# Patient Record
Sex: Female | Born: 2000 | Hispanic: Yes | Smoking: Never smoker
Health system: Southern US, Community
[De-identification: ages and names within clinical notes are randomized; demographics above are authoritative.]

## PROBLEM LIST (undated history)

## (undated) DIAGNOSIS — Z789 Other specified health status: Secondary | ICD-10-CM

## (undated) DIAGNOSIS — R87629 Unspecified abnormal cytological findings in specimens from vagina: Secondary | ICD-10-CM

## (undated) DIAGNOSIS — N6320 Unspecified lump in the left breast, unspecified quadrant: Secondary | ICD-10-CM

## (undated) HISTORY — DX: Other specified health status: Z78.9

## (undated) HISTORY — DX: Unspecified lump in the left breast, unspecified quadrant: N63.20

## (undated) HISTORY — PX: OTHER SURGICAL HISTORY: SHX169

## (undated) HISTORY — DX: Unspecified abnormal cytological findings in specimens from vagina: R87.629

---

## 2020-02-27 ENCOUNTER — Other Ambulatory Visit: Payer: Self-pay

## 2020-02-27 ENCOUNTER — Encounter: Payer: Self-pay | Admitting: Physician Assistant

## 2020-02-27 ENCOUNTER — Ambulatory Visit: Payer: Self-pay

## 2020-02-27 ENCOUNTER — Ambulatory Visit (LOCAL_COMMUNITY_HEALTH_CENTER): Payer: Self-pay | Admitting: Physician Assistant

## 2020-02-27 VITALS — BP 125/83 | Ht 63.0 in | Wt 194.0 lb

## 2020-02-27 DIAGNOSIS — Z3046 Encounter for surveillance of implantable subdermal contraceptive: Secondary | ICD-10-CM

## 2020-02-27 DIAGNOSIS — Z3009 Encounter for other general counseling and advice on contraception: Secondary | ICD-10-CM

## 2020-02-27 DIAGNOSIS — F32A Depression, unspecified: Secondary | ICD-10-CM

## 2020-02-27 DIAGNOSIS — Z01419 Encounter for gynecological examination (general) (routine) without abnormal findings: Secondary | ICD-10-CM

## 2020-02-27 DIAGNOSIS — Z30013 Encounter for initial prescription of injectable contraceptive: Secondary | ICD-10-CM

## 2020-02-27 DIAGNOSIS — F329 Major depressive disorder, single episode, unspecified: Secondary | ICD-10-CM

## 2020-02-27 DIAGNOSIS — Z113 Encounter for screening for infections with a predominantly sexual mode of transmission: Secondary | ICD-10-CM

## 2020-02-27 MED ORDER — THERA VITAL M PO TABS: 1.0000 | ORAL_TABLET | Freq: Every day | ORAL | 0 refills | Status: DC

## 2020-02-27 MED ORDER — MEDROXYPROGESTERONE ACETATE 150 MG/ML IM SUSP
150.0000 mg | INTRAMUSCULAR | Status: DC
  Administered 2020-02-27: 150 mg via INTRAMUSCULAR

## 2020-02-27 NOTE — Progress Notes (Signed)
Pt received Depo 150mg  IM per provider order and pt tolerated well. Pt scheduled to return 03/19/2020 at 10:40am for breast exam per 05/20/2020, PA verbal order. Appt card given to pt and pt aware to arrive early for check-in. Pt received MVI's per pt request. Sadie Haber, LCSW and cardinal cards with contact info given to pt per pt request and provider order. Provider orders completed.

## 2020-02-27 NOTE — Progress Notes (Signed)
Pt here for PE, Nexplanon removal and switch BCM's. Pt reports having irregular, long, painful periods, and weight gain with Nexplanon. RN counseling for Nexplanon removal completed and consent form reviewed and signed by pt.

## 2020-02-29 NOTE — Progress Notes (Signed)
Family Planning Visit- Repeat Yearly Visit  Subjective:  Savannah James is a 19 y.o. G2P2  being seen today for an well woman visit and to discuss family planning options.    She is currently using Nexplanon for pregnancy prevention. Patient reports she does not want a pregnancy in the next year. Patient  does not have a problem list on file.  Chief Complaint  Patient presents with  . Contraception    PE, Nexplanon removal and switch BCM    Patient reports that she would like her Nexplanon removed and to start Depo.  Reports that she has had irregular bleeding with the Nexplanon and did not have that with the Depo when she was on that method previously.  Reports that she has noticed a "ball" in her breast off and on for about 6 months.  States that it is more noticeable when she is having bleeding.  Also, concerned about weight gain.  Patient denies any other concerns today.    See flowsheet for other program required questions.   Body mass index is 34.37 kg/m. - Patient is eligible for diabetes screening based on BMI and age >59?  not applicable HA1C ordered? not applicable  Patient reports 1 of partners in last year. Desires STI screening?  Yes   Has patient been screened once for HCV in the past?  No  No results found for: HCVAB  Does the patient have current of drug use, have a partner with drug use, and/or has been incarcerated since last result? No  If yes-- Screen for HCV through [facility]   Does the patient meet criteria for HBV testing? No  Criteria:  -Household, sexual or needle sharing contact with HBV -History of drug use -HIV positive -Those with known Hep C   Health Maintenance Due  Topic Date Due  . Hepatitis C Screening  Never done  . COVID-19 Vaccine (1) Never done  . HIV Screening  Never done  . TETANUS/TDAP  Never done  . PAP-Cervical Cytology Screening  Never done  . PAP SMEAR-Modifier  Never done    Review of Systems  All other systems  reviewed and are negative.   The following portions of the patient's history were reviewed and updated as appropriate: allergies, current medications, past family history, past medical history, past social history, past surgical history and problem list. Problem list updated.  Objective:   Vitals:   02/27/20 1402  BP: 125/83  Weight: 194 lb (88 kg)  Height: 5\' 3"  (1.6 m)    Physical Exam Vitals and nursing note reviewed.  Constitutional:      General: She is not in acute distress.    Appearance: Normal appearance.  HENT:     Head: Normocephalic and atraumatic.  Eyes:     Conjunctiva/sclera: Conjunctivae normal.  Neck:     Thyroid: No thyroid mass, thyromegaly or thyroid tenderness.  Pulmonary:     Effort: Pulmonary effort is normal.  Chest:     Breasts:        Right: Normal.        Left: Normal.     Comments: L upper outer quadrant with area of thickened tissue. Slight tenderness, no distinct mass. Abdominal:     Palpations: Abdomen is soft. There is no mass.     Tenderness: There is no abdominal tenderness. There is no guarding or rebound.  Genitourinary:    General: Normal vulva.     Rectum: Normal.     Comments:  External genitalia/pubic area without nits, lice, edema, erythema, lesions and inguinal adenopathy. Vagina with normal mucosa, small amount of menstrual bleeding present. Cervix without visible lesions. Uterus firm, mobile, nt, no masses, no CMT, no adnexal tenderness or fullness. Musculoskeletal:     Cervical back: Neck supple. No tenderness.  Lymphadenopathy:     Upper Body:     Right upper body: No supraclavicular, axillary or pectoral adenopathy.     Left upper body: No supraclavicular, axillary or pectoral adenopathy.  Skin:    General: Skin is warm and dry.  Neurological:     Mental Status: She is alert and oriented to person, place, and time.  Psychiatric:        Mood and Affect: Mood normal.        Behavior: Behavior normal.        Thought  Content: Thought content normal.        Judgment: Judgment normal.       Assessment and Plan:  [name] is a 19 y.o. female G2P2 presenting to the [facility] Department for an yearly well woman exam/family planning visit  Contraception counseling: Reviewed all forms of birth control options in the tiered based approach. available including abstinence; over the counter/barrier methods; hormonal contraceptive medication including pill, patch, ring, injection,contraceptive implant, ECP; hormonal and nonhormonal IUDs; permanent sterilization options including vasectomy and the various tubal sterilization modalities. Risks, benefits, and typical effectiveness rates were reviewed.  Questions were answered.  Written information was also given to the patient to review.  Patient desires Nexplanon removal and to start Depo, this was prescribed for patient. She will follow up in  3-4 weeks for CBE and 3 months and prn for surveillance.  She was told to call with any further questions, or with any concerns about this method of contraception.  Emphasized use of condoms 100% of the time for STI prevention.  Patient was not a candidate for ECP.   1. Encounter for counseling regarding contraception Reviewed with patient that with the Depo patient can have similar SE as those she is having with Nexplanon. Rec condoms as back up for 2 weeks after shot today. Enc well balanced diet and regular exercise to help with general health and weight control. Enc MVI 1 po daily. Enc to establish with/follow up with PCP for illness, primary care concerns and age appropriate screening. RTC for repeat CBE in 3-4 weeks. - medroxyPROGESTERone (DEPO-PROVERA) injection 150 mg - Multiple Vitamins-Minerals (MULTIVITAMIN) tablet; Take 1 tablet by mouth daily.  Dispense: 100 tablet; Refill: 0  2. Nexplanon removal Nexplanon Removal Patient identified, informed consent performed, consent signed.    Appropriate time out taken. Nexplanon site identified.  Area prepped in usual sterile fashon. 3 ml of 1% lidocaine with Epinephrine was used to anesthetize the area at the distal end of the implant and along implant site. A small stab incision was made right beside the implant on the distal portion.  The Nexplanon rod was grasped manually  and removed without difficulty.  There was minimal blood loss. There were no complications.  Steri-strips were applied over the small incision.  A pressure bandage was applied to reduce any bruising.  The patient tolerated the procedure well and was given post procedure instructions.    Nexplanon:   Counseled patient to take OTC analgesic starting as soon as lidocaine starts to wear off and take regularly for at least 48 hr to decrease discomfort.  Specifically to take with food or milk to  decrease stomach upset and for IB 600 mg (3 tablets) every 6 hrs; IB 800 mg (4 tablets) every 8 hrs; or Aleve 2 tablets every 12 hrs.   3. Initiation of Depo Provera OK to start Depo 150mg  IM q 11-13 weeks for 1 year - medroxyPROGESTERone (DEPO-PROVERA) injection 150 mg  4. Pap smear, as part of routine gynecological examination Await test results.  Counseled that RN will call or send letter once results are back.  - Pap IG (Image Guided)  5. Screening for STD (sexually transmitted disease) Await test results.  Counseled that RN will call if needs to RTC for treatment once results are back.  - Chlamydia/Gonorrhea [facility]  6. Depression, unspecified depression type Patient referred to LCSW per her request. Give LCSW and [facility] cards to patient today.  - Ambulatory referral to Behavioral Health     Return in about 11 weeks (around 05/14/2020) for Depo, 03/19/2020 at 10:40am for breast exam, annual and PRN.  Future Appointments  Date Time Provider Department Center  03/19/2020 10:40 AM [facility] PROVIDER [facility] None    05/20/2020, PA

## 2020-03-02 LAB — PAP IG (IMAGE GUIDED): PAP Smear Comment: 0

## 2020-03-19 ENCOUNTER — Ambulatory Visit: Payer: Self-pay

## 2020-03-21 ENCOUNTER — Ambulatory Visit: Payer: Self-pay | Admitting: Licensed Clinical Social Worker

## 2020-03-21 ENCOUNTER — Ambulatory Visit (LOCAL_COMMUNITY_HEALTH_CENTER): Payer: Self-pay | Admitting: Physician Assistant

## 2020-03-21 ENCOUNTER — Other Ambulatory Visit: Payer: Self-pay

## 2020-03-21 ENCOUNTER — Encounter: Payer: Self-pay | Admitting: Physician Assistant

## 2020-03-21 VITALS — BP 134/83 | Ht <= 58 in | Wt 196.0 lb

## 2020-03-21 DIAGNOSIS — Z1239 Encounter for other screening for malignant neoplasm of breast: Secondary | ICD-10-CM

## 2020-03-21 DIAGNOSIS — Z3009 Encounter for other general counseling and advice on contraception: Secondary | ICD-10-CM

## 2020-03-21 DIAGNOSIS — Z09 Encounter for follow-up examination after completed treatment for conditions other than malignant neoplasm: Secondary | ICD-10-CM

## 2020-03-21 NOTE — Progress Notes (Signed)
Pt reports she is here for a repeat breast exam. Pt received Depo 02/27/2020, pt reports no issues with depo so far.

## 2020-03-21 NOTE — Progress Notes (Signed)
   [facility] problem [facility]  [facility]  Subjective:  Savannah James is a 19 y.o. being seen today for repeat CBE.  Chief Complaint  Patient presents with  . Contraception    repeat breast exam    HPI  Reports that she has not had any breast tenderness or felt the "ball" in her breast in 2 weeks.  States that she is doing well with the Depo and has stopped bleeding for now.   Does the patient have a current or past history of drug use? No   No components found for: HCV]   Health Maintenance Due  Topic Date Due  . Hepatitis C Screening  Never done  . COVID-19 Vaccine (1) Never done  . HIV Screening  Never done  . TETANUS/TDAP  Never done    Review of Systems  All other systems reviewed and are negative.   The following portions of the patient's history were reviewed and updated as appropriate: allergies, current medications, past family history, past medical history, past social history, past surgical history and problem list. Problem list updated.   See flowsheet for other program required questions.  Objective:   Vitals:   03/21/20 1459  BP: 134/83  Weight: 196 lb (88.9 kg)  Height: 4\' 10"  (1.473 m)    Physical Exam Vitals and nursing note reviewed.  Constitutional:      General: She is not in acute distress.    Appearance: Normal appearance.  HENT:     Head: Normocephalic and atraumatic.  Chest:     Breasts:        Right: Normal. No mass, nipple discharge, skin change or tenderness.        Left: Normal. No mass, nipple discharge, skin change or tenderness.     Comments: Thickened area of tissue in upper outer quadrant on left side is not present on exam today. Musculoskeletal:     Cervical back: Neck supple. No tenderness.  Lymphadenopathy:     Cervical: No cervical adenopathy.     Upper Body:     Right upper body: No supraclavicular, axillary or pectoral adenopathy.     Left upper body: No supraclavicular,  axillary or pectoral adenopathy.  Neurological:     Mental Status: She is alert.       Assessment and Plan:  Savannah James is a 19 y.o. female presenting to the [facility] Department for a Women's Health problem visit  1. Encounter for counseling regarding contraception Reviewed with patient SE of Depo and when to call clinic with bleeding concerns. Rec condoms with all sex. RTC for next Depo in about 7-8 weeks.  2. Follow-up exam Counseled that pap came back unsatisfactory due to not having enough cells. Counseled patient that she will need to make her next appointment for a pap and depo so that her pap can be repeated.  3. Screening breast examination Breast exam today is normal. Counseled patient that likely "ball" she had felt previously was due to hormonal changes related to bleeding and that she may experience this in the future around the time that her Depo is due or if she decides to stop using hormonal BCM she could have this each month with her periods. Enc patient to do regular SBE and call with any concerns.   There are no diagnoses linked to this encounter.    No follow-ups on file.  No future appointments.  [facility], PA

## 2020-05-07 ENCOUNTER — Telehealth: Payer: Self-pay

## 2020-05-07 NOTE — Telephone Encounter (Signed)
Telephone call to patient today to reschedule her PAP (due to an unsatisfactory PAP 02/2020).  Appointment rescheduled for 05/10/2020 at 9:20am ( arrival time 9:05am).  Marigene Ehlers interpreted the call and assisted with scheduling the appointment in Epic. Hart Carwin, RN

## 2020-05-10 ENCOUNTER — Ambulatory Visit: Payer: Self-pay

## 2020-05-18 ENCOUNTER — Ambulatory Visit: Payer: Self-pay

## 2020-06-21 ENCOUNTER — Ambulatory Visit (LOCAL_COMMUNITY_HEALTH_CENTER): Payer: Self-pay | Admitting: Physician Assistant

## 2020-06-21 ENCOUNTER — Other Ambulatory Visit: Payer: Self-pay

## 2020-06-21 VITALS — BP 135/82 | Ht 62.0 in | Wt 198.0 lb

## 2020-06-21 DIAGNOSIS — Z01419 Encounter for gynecological examination (general) (routine) without abnormal findings: Secondary | ICD-10-CM

## 2020-06-21 DIAGNOSIS — Z3009 Encounter for other general counseling and advice on contraception: Secondary | ICD-10-CM

## 2020-06-21 NOTE — Progress Notes (Signed)
Pap only today per patient.  Declines GC/Chlamydia. 02/27/20 was last Depo; trying to conceive; on MVI. Richmond Campbell, RN

## 2020-06-22 NOTE — Progress Notes (Signed)
[facility] problem [facility]  [facility]  Subjective:  Savannah James is a 19 y.o. being seen today for repeat pap.  No chief complaint on file.   HPI  Patient states that she needs a repeat pap due to unsatisfactory result on previous one.  Also, states that she would like to get pregnant and wants to d/c Depo.     Does the patient have a current or past history of drug use? No   No components found for: HCV]   Health Maintenance Due  Topic Date Due  . Hepatitis C Screening  Never done  . COVID-19 Vaccine (1) Never done  . HIV Screening  Never done  . TETANUS/TDAP  Never done  . INFLUENZA VACCINE  Never done    Review of Systems  All other systems reviewed and are negative.   The following portions of the patient's history were reviewed and updated as appropriate: allergies, current medications, past family history, past medical history, past social history, past surgical history and problem list. Problem list updated.   See flowsheet for other program required questions.  Objective:   Vitals:   06/21/20 1004  BP: 135/82  Weight: 198 lb (89.8 kg)  Height: 5\' 2"  (1.575 m)    Physical Exam Vitals and nursing note reviewed.  Constitutional:      General: She is not in acute distress.    Appearance: Normal appearance.  HENT:     Head: Normocephalic and atraumatic.  Eyes:     Conjunctiva/sclera: Conjunctivae normal.  Pulmonary:     Effort: Pulmonary effort is normal.  Abdominal:     Palpations: Abdomen is soft. There is no mass.     Tenderness: There is no abdominal tenderness. There is no guarding or rebound.  Genitourinary:    General: Normal vulva.     Rectum: Normal.     Comments: External genitalia/pubic area without nits, lice, edema, erythema, lesions and inguinal adenopathy. Vagina with normal mucosa and discharge. Cervix without visible lesions. Uterus firm, mobile, nt, no masses, no CMT, no adnexal  tenderness or fullness. Skin:    General: Skin is warm and dry.     Findings: No bruising, erythema, lesion or rash.  Neurological:     Mental Status: She is alert and oriented to person, place, and time.  Psychiatric:        Mood and Affect: Mood normal.        Behavior: Behavior normal.        Thought Content: Thought content normal.        Judgment: Judgment normal.       Assessment and Plan:  Savannah James is a 19 y.o. female presenting to the [facility] Department for a Women's Health problem visit  1. Encounter for counseling regarding contraception Counseled patient that Depo that she received in June will protect her from pregnancy until 06/25/2020, and after that she could get pregnant at any time. Counseled that it could take up to 18 months after d/c depo for periods to return to a normal pattern and in the meantime she could have irregular bleeding.  Reassured that this is normal and that it can sometimes frustrate people who are desiring a pregnancy and take them longer to achieve the pregnancy. RTC if has irregular bleeding that is heavy and frequent or to  discuss possibility of OCs for a few months to help regulate periods .  2. Pap test, as part  of routine gynecological examination Await test results.  Counseled that RN will call or send letter after results are back.  - Pap IG (Image Guided)     No follow-ups on file.  No future appointments.  Matt Holmes, PA

## 2020-06-24 LAB — PAP IG (IMAGE GUIDED): PAP Smear Comment: 0

## 2020-09-08 NOTE — L&D Delivery Note (Signed)
Delivery Note  Date of delivery: 05/14/2021 Estimated Date of Delivery: 05/09/21 Patient's last menstrual period was 08/01/2020. EGA: [redacted]w[redacted]d  Delivery Note At 9:15 PM a viable child was delivered via Vaginal, Spontaneous (Presentation: LOA).  APGAR: 8, 9; weight pending. Placenta status: Intact Spontaneous, Intact.  Cord: 3 vessels with the following complications: None.  Cord pH: n/a  Anesthesia: Epidural Episiotomy: None Lacerations: None Suture Repair:  n/a Est. Blood Loss (mL): 150  First Stage: Labor onset: 1230 Augmentation : Pitocin and AROM Analgesia /Anesthesia intrapartum: Epidural AROM at 1910  Savannah James presented to L&D with uterine contractions. She was augmented with pitocin. Epidural placed for pain relief.   Second Stage: Complete dilation at 2056 Onset of pushing at 2102 FHR second stage Cat I Delivery at 2115 on 05/14/2021  She progressed to complete and had a spontaneous vaginal birth of a live female over an intact perineum. The fetal head was delivered in OA position with restitution to LOA. No nuchal cord. Anterior then posterior shoulders delivered spontaneously. Baby placed on mom's abdomen and attended to by transition RN. Cord clamped and cut at 1 min by FOB. Cord blood obtained for newborn labs.  Third Stage: Placenta delivered intact with 3VC at 2122  Placenta disposition: routine disposal Uterine tone firm / bleeding min IV pitocin given for hemorrhage prophylaxis  No laceration identified  Anesthesia for repair: n/a Repair n/a Est. Blood Loss (mL):  Complications: none  Mom to postpartum.  Baby to Couplet care / Skin to Skin.  Newborn: Birth Weight: pending  Apgar Scores: 8, 9 Feeding planned: Both   Cyril Mourning, [place] 05/14/2021 9:27 PM

## 2020-09-10 ENCOUNTER — Other Ambulatory Visit: Payer: Self-pay

## 2020-09-10 ENCOUNTER — Ambulatory Visit (LOCAL_COMMUNITY_HEALTH_CENTER): Payer: Self-pay

## 2020-09-10 VITALS — BP 137/83 | Ht 62.0 in | Wt 201.5 lb

## 2020-09-10 DIAGNOSIS — Z3201 Encounter for pregnancy test, result positive: Secondary | ICD-10-CM

## 2020-09-10 LAB — PREGNANCY, URINE: Preg Test, Ur: POSITIVE — AB

## 2020-09-10 MED ORDER — PRENATAL 27-0.8 MG PO TABS: 1.0000 | ORAL_TABLET | Freq: Every day | ORAL | 0 refills | Status: AC

## 2020-09-10 NOTE — Progress Notes (Signed)
UPT positive. No [facility] on file. Plans prenatal care at [facility]. Sent to clerk for preadmit. Jerel Shepherd, RN

## 2020-10-15 NOTE — Progress Notes (Signed)
Patient and family history abstracted per R. Lassiter phone call to patient.Burt Knack, RN

## 2020-10-23 ENCOUNTER — Telehealth: Payer: Self-pay

## 2020-10-23 NOTE — Telephone Encounter (Signed)
TC to patient to reschedule tomorrow's appointment due to building maintenance issues. LM with number to call. Interpreter, [place], [place] #[id number] [facility] [employer]).Burt Knack, RN

## 2020-10-24 ENCOUNTER — Other Ambulatory Visit: Payer: Self-pay

## 2020-10-24 NOTE — Telephone Encounter (Signed)
Per Epic appt desk, has rescheduled new OB appt for 10/31/2020. Jossie Ng, RN

## 2020-10-31 ENCOUNTER — Other Ambulatory Visit: Payer: Self-pay

## 2020-10-31 ENCOUNTER — Ambulatory Visit: Payer: Medicaid Other | Admitting: Advanced Practice Midwife

## 2020-10-31 ENCOUNTER — Other Ambulatory Visit: Payer: Self-pay | Admitting: Family Medicine

## 2020-10-31 ENCOUNTER — Encounter: Payer: Self-pay | Admitting: Advanced Practice Midwife

## 2020-10-31 VITALS — BP 119/78 | HR 86 | Temp 98.8°F | Wt 198.6 lb

## 2020-10-31 DIAGNOSIS — Z3481 Encounter for supervision of other normal pregnancy, first trimester: Secondary | ICD-10-CM | POA: Diagnosis not present

## 2020-10-31 DIAGNOSIS — O99211 Obesity complicating pregnancy, first trimester: Secondary | ICD-10-CM

## 2020-10-31 DIAGNOSIS — N6320 Unspecified lump in the left breast, unspecified quadrant: Secondary | ICD-10-CM | POA: Insufficient documentation

## 2020-10-31 DIAGNOSIS — O99210 Obesity complicating pregnancy, unspecified trimester: Secondary | ICD-10-CM | POA: Insufficient documentation

## 2020-10-31 LAB — URINALYSIS
Bilirubin, UA: NEGATIVE
Glucose, UA: NEGATIVE
Ketones, UA: NEGATIVE
Leukocytes,UA: NEGATIVE
Nitrite, UA: NEGATIVE
Protein,UA: NEGATIVE
RBC, UA: NEGATIVE
Specific Gravity, UA: 1.015 (ref 1.005–1.030)
Urobilinogen, Ur: 0.2 mg/dL (ref 0.2–1.0)
pH, UA: 6 (ref 5.0–7.5)

## 2020-10-31 LAB — WET PREP FOR TRICH, YEAST, CLUE
Trichomonas Exam: NEGATIVE
Yeast Exam: NEGATIVE

## 2020-10-31 LAB — HEMOGLOBIN, FINGERSTICK: Hemoglobin: 12.7 g/dL (ref 11.1–15.9)

## 2020-10-31 NOTE — Progress Notes (Signed)
Here today for 13.0 week MH IP. Taking PNV QD. Denies ED.hospital visits since +PT. Early 1hgtt today. Declines both Flu and Covid Vaccines. Tawny Hopping, RN

## 2020-10-31 NOTE — Progress Notes (Addendum)
Hgb, UA and wet mount results reviewed. Per standing orders no treatment indicated. Epic computer system currently down. Explained to patient to call facility to reschedule next MH appt. Informed patient that [facility] and [facility] will be calling patient to schedule needed appts. Tawny Hopping, RN

## 2020-10-31 NOTE — Progress Notes (Signed)
[facility] HEALTH DEPT [facility] [address] [place] [address] [phone]  INITIAL PRENATAL VISIT NOTE  Subjective:  Savannah James is a 20 y.o. SHF G3P2002 (6, 3) nonsmoker at [redacted]w[redacted]d being seen today to start prenatal care at the [facility] Department. She feels "good" about surprise pregnancy with last DMPA 02/27/20.  22 yo employed FOB feels "good" about pregnancy; he is the father of all of her children.  She is living with her 71 yo m. Cousin (female) and her 2 children. Denies cigs, vaping, cigars.  Last ETOH 08/31/20 (6 shots Tequila) q 3 mo. Denies any u/s or ER use this pregnancy.  LMP 08/01/20. Last pap 06/21/20 neg. Wants BTL.  She is currently monitored for the following issues for this low-risk pregnancy and has Obesity affecting pregnancy BMI=36.3; Breast mass, left; and Supervision of normal intrauterine pregnancy in multigravida in first trimester on their problem list.  Patient reports no complaints.  Contractions: Not present. Vag. Bleeding: None.  Movement: Absent. Denies leaking of fluid.   Indications for ASA therapy (per uptodate) One of the following: Previous pregnancy with preeclampsia, especially early onset and with an adverse outcome No Multifetal gestation No Chronic hypertension No Type 1 or 2 diabetes mellitus No Chronic kidney disease No Autoimmune disease (antiphospholipid syndrome, systemic lupus erythematosus) No  Two or more of the following: Nulliparity No Obesity (body mass index >30 kg/m2) Yes Family history of preeclampsia in mother or sister No Age ?35 years No Sociodemographic characteristics (African American race, low socioeconomic level) No Personal risk factors (eg, previous pregnancy with low birth weight or small for gestational age infant, previous adverse pregnancy outcome [eg, stillbirth], interval >10 years between pregnancies) No   The following portions of the  patient's history were reviewed and updated as appropriate: allergies, current medications, past family history, past medical history, past social history, past surgical history and problem list. Problem list updated.  Objective:   Vitals:   10/31/20 1335  BP: 119/78  Pulse: 86  Temp: 98.8 F (37.1 C)  Weight: 198 lb 9.6 oz (90.1 kg)    Fetal Status: Fetal Heart Rate (bpm): not heard Fundal Height: 10 cm Movement: Absent  Presentation: Undeterminableno FHR heard, fundal height=10 wks size, denies vaginal bleeding See maternal flow sheet  Physical Exam Constitutional:      Appearance: Normal appearance. She is obese.  HENT:     Head: Normocephalic.     Nose: Nose normal.     Mouth/Throat:     Mouth: Mucous membranes are moist.  Eyes:     Conjunctiva/sclera: Conjunctivae normal.  Neck:     Comments: Thyroid without masses or enlargement Negative cervical adenopathy Cardiovascular:     Rate and Rhythm: Normal rate and regular rhythm.     Heart sounds: Normal heart sounds.  Pulmonary:     Effort: Pulmonary effort is normal.     Breath sounds: Normal breath sounds.  Chest:  Breasts:     Right: Normal.      Comments: Left breast 1:00 position 3x3 cm nontender firm round mass 12:00 position 1x1 cm round nontender firm mass onset 01/2020 Abdominal:     Palpations: Abdomen is soft.     Comments: Soft without masses or tenderness Fundus 10 wks size, no FHR heard  Genitourinary:    General: Normal vulva.     Exam position: Lithotomy position.     Vagina: Vaginal discharge (white creamy leukorrhea, ph<4.5) present.  Cervix: Normal.     Uterus: Normal. Enlarged (enlarged 10 wks size).      Rectum: Normal.  Musculoskeletal:        General: Normal range of motion.     Cervical back: Normal range of motion and neck supple.  Skin:    General: Skin is warm and dry.  Neurological:     Mental Status: She is alert.  Psychiatric:        Mood and Affect: Mood normal.       Assessment and Plan:  Pregnancy: G3P2002 at [redacted]w[redacted]d  1. Obesity affecting pregnancy, antepartum Agrees to take ASA 81 mg daily Counseled on wt gain of 11-20 lb this pregnancy  2. Supervision of normal intrauterine pregnancy in multigravida in first trimester Desires Quad screen U/s ordered [facility] for viability/dating now Referred to BCCCP for 2 breast masses in left breast since 01/2020 1 hour glucola today - Lead, blood (adult age 17 yrs or greater) - Glucose, 1 hour gestational - HCV Ab w/Rflx to Verification - Hemoglobinopathy evaluation -[id number] - Hgb A1c w/o eAG - HIV Antibody (routine testing w rflx) - Comprehensive metabolic panel - Prenatal Profile with Varicella(282020) - Protein / creatinine ratio, urine  (Spot) - TSH - Urine Culture - Chlamydia/GC NAA, Confirmation - QuantiFERON-TB Gold Plus - 633354 Drug Screen - WET PREP FOR TRICH, YEAST, CLUE - Urinalysis (Urine Dip) - Hemoglobin, venipuncture - [place] MFM OB COMPLETE LESS THAN 14 WEEKS; Future  3. Breast mass, left Referred to BCCCP for 2 breast masses in left breast since 01/2020 (no insurance or Medicaid); see above for details    Discussed overview of care and coordination with inpatient delivery practices including [facility], [facility], [facility] and [facility].   Reviewed Centering pregnancy as standard of care at [facility], oriented to room and showed video. Based on EDD, plan for Cycle .    Preterm labor symptoms and general obstetric precautions including but not limited to vaginal bleeding, contractions, leaking of fluid and fetal movement were reviewed in detail with the patient.  Please refer to After Visit Summary for other counseling recommendations.   Return in about 4 weeks (around 11/28/2020) for routine PNC.  Future Appointments  Date Time Provider Department Center  11/13/2020  8:00 AM [facility] CLINIC [facility] None  11/30/2020  2:00 PM [facility] PROVIDER [facility] None    Alberteen Spindle,  CNM

## 2020-11-01 LAB — LEAD, BLOOD (ADULT >= 16 YRS): Lead-Whole Blood: <1 ug/dL (ref 0–4)

## 2020-11-02 LAB — URINE CULTURE: Organism ID, Bacteria: NO GROWTH

## 2020-11-02 LAB — 789231 7+OXYCODONE-BUND
Amphetamines, Urine: NEGATIVE ng/mL
BENZODIAZ UR QL: NEGATIVE ng/mL
Barbiturate screen, urine: NEGATIVE ng/mL
Cannabinoid Quant, Ur: NEGATIVE ng/mL
Cocaine (Metab.): NEGATIVE ng/mL
OPIATE SCREEN URINE: NEGATIVE ng/mL
Oxycodone/Oxymorphone, Urine: NEGATIVE ng/mL
PCP Quant, Ur: NEGATIVE ng/mL

## 2020-11-02 LAB — PROTEIN / CREATININE RATIO, URINE
Creatinine, Urine: 71.4 mg/dL
Protein, Ur: 6.4 mg/dL
Protein/Creat Ratio: 90 mg/g creat (ref 0–200)

## 2020-11-03 LAB — CBC/D/PLT+RPR+RH+ABO+RUB AB...
Antibody Screen: NEGATIVE
Basophils Absolute: 0 10*3/uL (ref 0.0–0.2)
Basos: 0 %
EOS (ABSOLUTE): 0.1 10*3/uL (ref 0.0–0.4)
Eos: 2 %
Hematocrit: 39 % (ref 34.0–46.6)
Hemoglobin: 14.1 g/dL (ref 11.1–15.9)
Hepatitis B Surface Ag: NEGATIVE
Immature Grans (Abs): 0.1 10*3/uL (ref 0.0–0.1)
Immature Granulocytes: 1 %
Lymphocytes Absolute: 2.2 10*3/uL (ref 0.7–3.1)
Lymphs: 24 %
MCH: 31.8 pg (ref 26.6–33.0)
MCHC: 36.2 g/dL — ABNORMAL HIGH (ref 31.5–35.7)
MCV: 88 fL (ref 79–97)
Monocytes Absolute: 0.6 10*3/uL (ref 0.1–0.9)
Monocytes: 7 %
Neutrophils Absolute: 6 10*3/uL (ref 1.4–7.0)
Neutrophils: 66 %
Platelets: 236 10*3/uL (ref 150–450)
RBC: 4.43 x10E6/uL (ref 3.77–5.28)
RDW: 12.9 % (ref 11.7–15.4)
RPR Ser Ql: NONREACTIVE
Rh Factor: POSITIVE
Rubella Antibodies, IGG: 4.57 index (ref >0.99)
Varicella zoster IgG: 385 index (ref >165)
WBC: 8.9 10*3/uL (ref 3.4–10.8)

## 2020-11-03 LAB — QUANTIFERON-TB GOLD PLUS
QuantiFERON Mitogen Value: >10 IU/mL
QuantiFERON Nil Value: 0 IU/mL
QuantiFERON TB1 Ag Value: 0.03 IU/mL
QuantiFERON TB2 Ag Value: 0.01 IU/mL
QuantiFERON-TB Gold Plus: NEGATIVE

## 2020-11-03 LAB — COMPREHENSIVE METABOLIC PANEL
ALT: 12 IU/L (ref 0–32)
AST: 14 IU/L (ref 0–40)
Albumin/Globulin Ratio: 1.7 (ref 1.2–2.2)
Albumin: 4.4 g/dL (ref 3.9–5.0)
Alkaline Phosphatase: 66 IU/L (ref 44–121)
BUN/Creatinine Ratio: 10 (ref 9–23)
BUN: 6 mg/dL (ref 6–20)
Bilirubin Total: 0.3 mg/dL (ref 0.0–1.2)
CO2: 21 mmol/L (ref 20–29)
Calcium: 9.5 mg/dL (ref 8.7–10.2)
Chloride: 102 mmol/L (ref 96–106)
Creatinine, Ser: 0.58 mg/dL (ref 0.57–1.00)
GFR calc Af Amer: 152 mL/min/{1.73_m2} (ref >59)
GFR calc non Af Amer: 132 mL/min/{1.73_m2} (ref >59)
Globulin, Total: 2.6 g/dL (ref 1.5–4.5)
Glucose: 77 mg/dL (ref 65–99)
Potassium: 4.1 mmol/L (ref 3.5–5.2)
Sodium: 139 mmol/L (ref 134–144)
Total Protein: 7 g/dL (ref 6.0–8.5)

## 2020-11-03 LAB — HGB FRACTIONATION CASCADE
Hgb A2: 2.7 % (ref 1.8–3.2)
Hgb A: 96.1 % — ABNORMAL LOW (ref 96.4–98.8)
Hgb F: 1.2 % (ref 0.0–2.0)
Hgb S: 0 %

## 2020-11-03 LAB — HIV-1/HIV-2 QUALITATIVE RNA
HIV-1 RNA, Qualitative: NONREACTIVE
HIV-2 RNA, Qualitative: NONREACTIVE

## 2020-11-03 LAB — HCV INTERPRETATION

## 2020-11-03 LAB — TSH: TSH: 1.48 u[IU]/mL (ref 0.450–4.500)

## 2020-11-03 LAB — HGB A1C W/O EAG: Hgb A1c MFr Bld: 4.5 % — ABNORMAL LOW (ref 4.8–5.6)

## 2020-11-03 LAB — GLUCOSE, 1 HOUR GESTATIONAL: Gestational Diabetes Screen: 90 mg/dL (ref 65–139)

## 2020-11-03 LAB — HCV AB W REFLEX TO QUANT PCR: HCV Ab: <0.1 s/co ratio (ref 0.0–0.9)

## 2020-11-04 LAB — CHLAMYDIA/GC NAA, CONFIRMATION
Chlamydia trachomatis, NAA: NEGATIVE
Neisseria gonorrhoeae, NAA: NEGATIVE

## 2020-11-08 ENCOUNTER — Telehealth: Payer: Self-pay | Admitting: Family Medicine

## 2020-11-08 NOTE — Telephone Encounter (Signed)
Pt states that she was told at her last visit that she would be referred for an ultrasound and she has not received a call from anyone regarding an [place]

## 2020-11-09 ENCOUNTER — Other Ambulatory Visit: Payer: Self-pay | Admitting: Advanced Practice Midwife

## 2020-11-09 DIAGNOSIS — Z3481 Encounter for supervision of other normal pregnancy, first trimester: Secondary | ICD-10-CM

## 2020-11-09 NOTE — Telephone Encounter (Signed)
Electronic order corrected by provider by adding transvaginal US as needed and call made to scheduler. Appt for viability / dating [place] scheduled for 11/12/2020 at 5:00 pm at [facility]. Call to client and left message to call ASAP for [place] appt. No emergency contact available and request to obtain added to pink sticky note. Jossie Ng, RN

## 2020-11-12 ENCOUNTER — Ambulatory Visit: Admission: RE | Admit: 2020-11-12 | Payer: Self-pay | Source: Ambulatory Visit

## 2020-11-12 NOTE — Telephone Encounter (Signed)
Call to client with [facility] viability [place] today at 5 pm. Left message with above information and requested she return call. Number to call provided. Jossie Ng, RN

## 2020-11-12 NOTE — Telephone Encounter (Signed)
Call to client as no response yet to previous calls. Viability [place] scheduled this pm at [facility] at 1700. Per [employer] ID [phone], left message with number to call provided. Jossie Ng, RN

## 2020-11-13 ENCOUNTER — Telehealth: Payer: Self-pay | Admitting: Family Medicine

## 2020-11-13 ENCOUNTER — Ambulatory Visit: Payer: Self-pay

## 2020-11-13 NOTE — Telephone Encounter (Signed)
Pt. stopped by today to see when her ultrasoud will be scheduled.

## 2020-11-13 NOTE — Telephone Encounter (Signed)
MH RN to call [facility] and reschedule U/S and will call pt once new appt is made.

## 2020-11-13 NOTE — Telephone Encounter (Signed)
Phone call to pt at [phone] with interpreter Salli Real. Pt confirms that she came to [facility] to inquire about U/S appt this morning; told by clerical that she would leave message for RN and RN would call her back.  Pt confirms that [phone] is best number. Pt counseled that her U/S appt was actually scheduled for yesterday at 5:00 PM.  Pt requests that U/S be rescheduled for 11/15/20, anytime that day.

## 2020-11-13 NOTE — Telephone Encounter (Signed)
Letter in Spanish written by Dr. Alvester Morin with 11/14/2020 [place] appt information. As no response from client via phone, letter delivered to address of record by Jamse Arn RN. Per Ms. Roseanne Reno, letter left on door as no response to her knock. Jossie Ng, RN

## 2020-11-13 NOTE — Telephone Encounter (Signed)
As unable to contact client via phone for viability [place] on 11/12/2020 at 1700, appt was not kept and [place] needs to be rescheduled. New appt is 11/14/2020 with arrival time of 0945 (with a full bladder). No available appts on 11/15/2020 as requested by client. Call to client with [facility] [employer] ID [phone] and left message with [place] appt and requested she call back for additional instructions (full bladder needed). Number to call provided. Jossie Ng, RN

## 2020-11-13 NOTE — Telephone Encounter (Signed)
Pt missed call from nurse about missed ultrasound appt. Was calling back to confirm new appt was scheduled.

## 2020-11-13 NOTE — Telephone Encounter (Signed)
See new phone note started 11/13/20.

## 2020-11-14 ENCOUNTER — Other Ambulatory Visit: Payer: Self-pay | Admitting: Advanced Practice Midwife

## 2020-11-14 ENCOUNTER — Other Ambulatory Visit: Payer: Self-pay

## 2020-11-14 ENCOUNTER — Encounter: Payer: Self-pay | Admitting: Advanced Practice Midwife

## 2020-11-14 ENCOUNTER — Ambulatory Visit
Admission: RE | Admit: 2020-11-14 | Discharge: 2020-11-14 | Disposition: A | Discharge status: Home / Self Care | Payer: Self-pay | Source: Ambulatory Visit | Attending: Advanced Practice Midwife | Admitting: Advanced Practice Midwife

## 2020-11-14 DIAGNOSIS — Z3481 Encounter for supervision of other normal pregnancy, first trimester: Secondary | ICD-10-CM | POA: Insufficient documentation

## 2020-11-14 DIAGNOSIS — O4400 Placenta previa specified as without hemorrhage, unspecified trimester: Secondary | ICD-10-CM | POA: Insufficient documentation

## 2020-11-14 NOTE — Telephone Encounter (Signed)
Call to [facility] sonography phone number and verified with receptionist that client was scanned this am. Report pending. Jossie Ng, RN

## 2020-11-21 ENCOUNTER — Ambulatory Visit
Admission: RE | Admit: 2020-11-21 | Discharge: 2020-11-21 | Disposition: A | Discharge status: Home / Self Care | Payer: Self-pay | Source: Ambulatory Visit | Attending: Oncology | Admitting: Oncology

## 2020-11-21 ENCOUNTER — Ambulatory Visit: Payer: Self-pay | Attending: Oncology

## 2020-11-21 ENCOUNTER — Other Ambulatory Visit: Payer: Self-pay

## 2020-11-21 VITALS — BP 118/72 | HR 88 | Temp 97.6°F | Ht 63.39 in | Wt 200.4 lb

## 2020-11-21 DIAGNOSIS — N63 Unspecified lump in unspecified breast: Secondary | ICD-10-CM

## 2020-11-21 NOTE — Progress Notes (Signed)
  Subjective:     Patient ID: Savannah James, female   DOB: Jul 31, 2001, 20 y.o.   MRN: [id number]  HPI   Review of Systems     Objective:   Physical Exam Chest:  Breasts:     Right: Mass present. No swelling, bleeding, inverted nipple, nipple discharge, skin change or tenderness.     Left: Mass present. No swelling, bleeding, inverted nipple, nipple discharge, skin change or tenderness.          Assessment:     20 year old Hispanic patient , [redacted] weeks pregnant, referred from [facility] for 2 left breast masses.  Patient states masses have been present for one year.  States she was taken off of her birth control to hopefully decrease mass, but did not work, and she feels they are getting larger. Patient screened, and meets BCCCP eligibility.  Patient does not have insurance, Medicare or Medicaid. Instructed patient on breast self awareness using teach back method.  Palpated 2 firm mobile masses left breast 3 cm at 1:00 and linear mass angled from 11-12 o'clock left breast.  Palpated a thickening at 12 o'clock right breast. Fuller Song Laukaitis interpreted exam.     Plan:     Sent for bilateral breast ultrasound.

## 2020-11-22 ENCOUNTER — Other Ambulatory Visit: Payer: Self-pay | Admitting: *Deleted

## 2020-11-22 DIAGNOSIS — N63 Unspecified lump in unspecified breast: Secondary | ICD-10-CM

## 2020-11-26 ENCOUNTER — Other Ambulatory Visit: Payer: Self-pay | Admitting: *Deleted

## 2020-11-30 ENCOUNTER — Ambulatory Visit
Admission: RE | Admit: 2020-11-30 | Discharge: 2020-11-30 | Disposition: A | Discharge status: Home / Self Care | Payer: Self-pay | Source: Ambulatory Visit | Attending: Oncology | Admitting: Oncology

## 2020-11-30 ENCOUNTER — Ambulatory Visit: Payer: Medicaid Other | Admitting: Advanced Practice Midwife

## 2020-11-30 ENCOUNTER — Ambulatory Visit: Payer: Self-pay

## 2020-11-30 ENCOUNTER — Other Ambulatory Visit: Payer: Self-pay

## 2020-11-30 VITALS — BP 124/76 | HR 85 | Temp 98.6°F | Wt 201.6 lb

## 2020-11-30 DIAGNOSIS — N6320 Unspecified lump in the left breast, unspecified quadrant: Secondary | ICD-10-CM

## 2020-11-30 DIAGNOSIS — O99212 Obesity complicating pregnancy, second trimester: Secondary | ICD-10-CM | POA: Diagnosis not present

## 2020-11-30 DIAGNOSIS — Z3482 Encounter for supervision of other normal pregnancy, second trimester: Secondary | ICD-10-CM | POA: Diagnosis not present

## 2020-11-30 DIAGNOSIS — O4402 Placenta previa specified as without hemorrhage, second trimester: Secondary | ICD-10-CM | POA: Diagnosis not present

## 2020-11-30 DIAGNOSIS — N63 Unspecified lump in unspecified breast: Secondary | ICD-10-CM

## 2020-11-30 DIAGNOSIS — O99210 Obesity complicating pregnancy, unspecified trimester: Secondary | ICD-10-CM

## 2020-11-30 DIAGNOSIS — Z3481 Encounter for supervision of other normal pregnancy, first trimester: Secondary | ICD-10-CM

## 2020-11-30 NOTE — Progress Notes (Signed)
   PRENATAL VISIT NOTE  Subjective:  Savannah James is a 20 y.o. G3P2002 at [redacted]w[redacted]d being seen today for ongoing prenatal care.  She is currently monitored for the following issues for this low-risk pregnancy and has Obesity affecting pregnancy BMI=36.3; Breast mass, left; Supervision of normal intrauterine pregnancy in multigravida in first trimester; and Placenta previa on 11/14/20 u/s @ 14 6/7 on their problem list.  Patient reports no complaints.  Contractions: Not present. Vag. Bleeding: None.  Movement: Present. Denies leaking of fluid/ROM.   The following portions of the patient's history were reviewed and updated as appropriate: allergies, current medications, past family history, past medical history, past social history, past surgical history and problem list. Problem list updated.  Objective:   Vitals:   11/30/20 1350  BP: 124/76  Pulse: 85  Temp: 98.6 F (37 C)  Weight: 201 lb 9.6 oz (91.4 kg)    Fetal Status: Fetal Heart Rate (bpm): 150 Fundal Height: 18 cm Movement: Present     General:  Alert, oriented and cooperative. Patient is in no acute distress.  Skin: Skin is warm and dry. No rash noted.   Cardiovascular: Normal heart rate noted  Respiratory: Normal respiratory effort, no problems with respiration noted  Abdomen: Soft, gravid, appropriate for gestational age.  Pain/Pressure: Absent     Pelvic: Cervical exam deferred        Extremities: Normal range of motion.  Edema: None  Mental Status: Normal mood and affect. Normal behavior. Normal judgment and thought content.   Assessment and Plan:  Pregnancy: G3P2002 at [redacted]w[redacted]d  1. Breast mass, left Rescheduled 11/13/20 apt to 11/21/20 with u/s Today had breast bx with results to come next week  2. Placenta previa in second trimester Counseled pt on implications and f/u plan  3. Supervision of normal intrauterine pregnancy in multigravida in first trimester Wants BTL--counseled no insurance or Medicaid so would have  to pay out of pocket; discussed vasectomy and IUD Reviewed 11/14/20 u/s with posterior previa, AFI wnl at 14 6/7 unable to completely visualize anatomy due to fetal position Anatomy u/s and f/u previa ordered for 20 wks Pt desires Quad screen today 4. Obesity affecting pregnancy, antepartum 6 lb 9.6 oz (2.994 kg) Not taking ASA 81 mg so too late to start Walking 5x/wk x 15 min   Preterm labor symptoms and general obstetric precautions including but not limited to vaginal bleeding, contractions, leaking of fluid and fetal movement were reviewed in detail with the patient. Please refer to After Visit Summary for other counseling recommendations.  Return in about 4 weeks (around 12/28/2020) for routine PNC.  No future appointments.  Alberteen Spindle, CNM

## 2020-11-30 NOTE — Progress Notes (Signed)
Patient here for routine OB visit.   Patient states she is interested in BTL after delivery. Patient informed that BTL is not covered through presumptive Medicaid so other options may need to be explored. [facility] financial phone number given to patient to inquire about BTL cost.  Patient states she may be interested in IUD if BTL does not work out.   IUD booked information given for hormonal and non-hormonal.   Patient is interested in [place] screen. Consent signed.   Unable to update emergency contact number. Patient states she has no family her friends in the area.

## 2020-12-03 LAB — SURGICAL PATHOLOGY

## 2020-12-07 ENCOUNTER — Encounter: Payer: Self-pay | Admitting: Advanced Practice Midwife

## 2020-12-07 DIAGNOSIS — Z3481 Encounter for supervision of other normal pregnancy, first trimester: Secondary | ICD-10-CM

## 2020-12-11 ENCOUNTER — Telehealth: Payer: Self-pay | Admitting: Family Medicine

## 2020-12-11 NOTE — Telephone Encounter (Signed)
Electronic referral made 11/30/2020 by Hazle Coca CNM for OB US + 14 weeks. Call to [facility] [place] scheduler and was told he could not schedule as a +14 week Korea. RN instructed would have to be scheduled by Britta Mccreedy or Marylu Lund in [place] scheduling. Call to [facility] scheduling and left message requesting return call regarding [place]. Call to client with [facility] [employer] and left message requesting client call regarding [place] appt. Number to call provided.Jossie Ng, RN

## 2020-12-11 NOTE — Telephone Encounter (Signed)
Please regarding my ultrasound appointment

## 2020-12-12 ENCOUNTER — Telehealth: Payer: Self-pay

## 2020-12-12 NOTE — Telephone Encounter (Signed)
Call to [facility] scheduler and anatomy US scheduled for 01/18/2021 with arrival time of 1:15 pm with a full bladder. Call to client with [facility] [employer] ID # [phone] and left message to call [facility] for [place] appt. Jossie Ng, RN

## 2020-12-12 NOTE — Telephone Encounter (Signed)
Appt scheduled for 01/18/2021. Please refer to phone encounter opened 12/12/2020. Jossie Ng, RN

## 2020-12-12 NOTE — Telephone Encounter (Signed)
Call to client with [place] appt. Savannah James, interpreter left message for client to call [facility] for [place] appt. While documenting phone encounter, received call from Ms. James as client had returned call to her phone number. Client re-called and [place] appt provided with stated understanding by client. Savannah Ng, RN

## 2020-12-28 ENCOUNTER — Other Ambulatory Visit: Payer: Self-pay

## 2020-12-28 ENCOUNTER — Ambulatory Visit: Payer: Self-pay | Admitting: Advanced Practice Midwife

## 2020-12-28 VITALS — BP 115/67 | HR 87 | Temp 97.4°F | Wt 201.2 lb

## 2020-12-28 DIAGNOSIS — Z3481 Encounter for supervision of other normal pregnancy, first trimester: Secondary | ICD-10-CM

## 2020-12-28 DIAGNOSIS — O99210 Obesity complicating pregnancy, unspecified trimester: Secondary | ICD-10-CM

## 2020-12-28 DIAGNOSIS — O99212 Obesity complicating pregnancy, second trimester: Secondary | ICD-10-CM

## 2020-12-28 DIAGNOSIS — Z3482 Encounter for supervision of other normal pregnancy, second trimester: Secondary | ICD-10-CM

## 2020-12-28 DIAGNOSIS — N6320 Unspecified lump in the left breast, unspecified quadrant: Secondary | ICD-10-CM

## 2020-12-28 DIAGNOSIS — O4402 Placenta previa specified as without hemorrhage, second trimester: Secondary | ICD-10-CM

## 2020-12-28 NOTE — Progress Notes (Signed)
Emergency contact added to demographic screen. Aware of [facility] [place] appt on 01/18/2021 with arrival time of 01/18/2021. Jossie Ng, RN

## 2020-12-28 NOTE — Progress Notes (Signed)
   PRENATAL VISIT NOTE  Subjective:  Savannah James is a 20 y.o. G3P2002 at [redacted]w[redacted]d being seen today for ongoing prenatal care.  She is currently monitored for the following issues for this low-risk pregnancy and has Obesity affecting pregnancy BMI=36.3; Breast mass, left; Supervision of normal intrauterine pregnancy in multigravida in first trimester; and Placenta previa on 11/14/20 u/s @ 14 6/7 on their problem list.  Patient reports backache.  Contractions: Not present. Vag. Bleeding: None.  Movement: Present. Denies leaking of fluid/ROM.   The following portions of the patient's history were reviewed and updated as appropriate: allergies, current medications, past family history, past medical history, past social history, past surgical history and problem list. Problem list updated.  Objective:   Vitals:   12/28/20 1413  BP: 115/67  Pulse: 87  Temp: (!) 97.4 F (36.3 C)  Weight: 201 lb 3.2 oz (91.3 kg)    Fetal Status: Fetal Heart Rate (bpm): 150 Fundal Height: 25 cm Movement: Present     General:  Alert, oriented and cooperative. Patient is in no acute distress.  Skin: Skin is warm and dry. No rash noted.   Cardiovascular: Normal heart rate noted  Respiratory: Normal respiratory effort, no problems with respiration noted  Abdomen: Soft, gravid, appropriate for gestational age.  Pain/Pressure: Absent     Pelvic: Cervical exam deferred        Extremities: Normal range of motion.  Edema: None  Mental Status: Normal mood and affect. Normal behavior. Normal judgment and thought content.   Assessment and Plan:  Pregnancy: G3P2002 at [redacted]w[redacted]d  1. Obesity affecting pregnancy, antepartum 6 lb 3.2 oz (2.812 kg) Chose not to take ASA 81 mg Not exercising this week--encouraged to do so  2. Supervision of normal intrauterine pregnancy in multigravida in first trimester Quad neg Not working Pt reminded of anatomy u/s 01/18/21  3. Placenta previa in second trimester F/u anatomy u/s  01/18/21  4. 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 Please refer to After Visit Summary for other counseling recommendations.  Return in about 4 weeks (around 01/25/2021) for routine PNC.  Future Appointments  Date Time Provider Department Center  01/18/2021  1:30 PM [facility]    Alberteen Spindle, CNM

## 2021-01-18 ENCOUNTER — Encounter: Payer: Self-pay | Admitting: Advanced Practice Midwife

## 2021-01-18 ENCOUNTER — Ambulatory Visit
Admission: RE | Admit: 2021-01-18 | Discharge: 2021-01-18 | Disposition: A | Discharge status: Home / Self Care | Payer: Self-pay | Source: Ambulatory Visit | Attending: Advanced Practice Midwife | Admitting: Advanced Practice Midwife

## 2021-01-18 ENCOUNTER — Other Ambulatory Visit: Payer: Self-pay

## 2021-01-18 DIAGNOSIS — Z3481 Encounter for supervision of other normal pregnancy, first trimester: Secondary | ICD-10-CM | POA: Insufficient documentation

## 2021-01-18 DIAGNOSIS — O359XX0 Maternal care for (suspected) fetal abnormality and damage, unspecified, not applicable or unspecified: Secondary | ICD-10-CM | POA: Insufficient documentation

## 2021-01-24 ENCOUNTER — Telehealth: Payer: Self-pay

## 2021-01-24 NOTE — Telephone Encounter (Signed)
TC to patient to reschedule her 01/25/2021 MH RV due to lack of provider availability. Patient is rescheduled for 01/30/2021.Marland KitchenBurt Knack, RN

## 2021-01-25 ENCOUNTER — Ambulatory Visit: Payer: Self-pay

## 2021-01-28 NOTE — Progress Notes (Signed)
Benign biopsy results delivered to patient.  To return to clinic as needed.  Annual screening mammograms recommended at 22 years of age. Copy to [facility].

## 2021-01-29 ENCOUNTER — Telehealth: Payer: Self-pay

## 2021-01-29 NOTE — Telephone Encounter (Signed)
Contacted patient to reschedule appointment from 01/30/21 to 02/06/21. Patient to arrive at 9:45.   Patient agrees with plan of care. Spoke to patient in spanish.   Floy Sabina, RN

## 2021-01-30 ENCOUNTER — Ambulatory Visit: Payer: Self-pay

## 2021-01-30 NOTE — Addendum Note (Signed)
Addended by: Heywood Bene on: 01/30/2021 02:48 PM   Modules accepted: Orders

## 2021-02-06 ENCOUNTER — Ambulatory Visit: Payer: Self-pay | Admitting: Advanced Practice Midwife

## 2021-02-06 ENCOUNTER — Other Ambulatory Visit: Payer: Self-pay

## 2021-02-06 VITALS — BP 125/73 | HR 109 | Temp 98.5°F | Wt 206.4 lb

## 2021-02-06 DIAGNOSIS — Z3481 Encounter for supervision of other normal pregnancy, first trimester: Secondary | ICD-10-CM

## 2021-02-06 DIAGNOSIS — O99210 Obesity complicating pregnancy, unspecified trimester: Secondary | ICD-10-CM

## 2021-02-06 DIAGNOSIS — O359XX0 Maternal care for (suspected) fetal abnormality and damage, unspecified, not applicable or unspecified: Secondary | ICD-10-CM

## 2021-02-06 DIAGNOSIS — O99212 Obesity complicating pregnancy, second trimester: Secondary | ICD-10-CM

## 2021-02-06 DIAGNOSIS — N6320 Unspecified lump in the left breast, unspecified quadrant: Secondary | ICD-10-CM

## 2021-02-06 DIAGNOSIS — Z3482 Encounter for supervision of other normal pregnancy, second trimester: Secondary | ICD-10-CM

## 2021-02-06 DIAGNOSIS — O4402 Placenta previa specified as without hemorrhage, second trimester: Secondary | ICD-10-CM

## 2021-02-06 LAB — HEMOGLOBIN, FINGERSTICK: Hemoglobin: 11.7 g/dL (ref 11.1–15.9)

## 2021-02-06 NOTE — Progress Notes (Signed)
Hgb = 11.7 and no intervention required per standing order. Client counseled on recommendation for people who will spend time around infant. Client tolerated Tdap today without difficulty. Jossie Ng, RN

## 2021-02-06 NOTE — Progress Notes (Signed)
   PRENATAL VISIT NOTE  Subjective:  Savannah James is a 20 y.o. G3P2002 at [redacted]w[redacted]d being seen today for ongoing prenatal care.  She is currently monitored for the following issues for this low-risk pregnancy and has Obesity affecting pregnancy BMI=36.3; Breast mass, left; Supervision of normal intrauterine pregnancy in multigravida in first trimester; Placenta previa on 11/14/20 u/s @ 14 6/7; and Fetal abnormality during pregnancy, antepartum left pyelectasis on 01/18/21 u/s on their problem list.  Patient reports no complaints.  Contractions: Not present. Vag. Bleeding: None.  Movement: Present. Denies leaking of fluid/ROM.   The following portions of the patient's history were reviewed and updated as appropriate: allergies, current medications, past family history, past medical history, past social history, past surgical history and problem list. Problem list updated.  Objective:   Vitals:   02/06/21 1018  BP: 125/73  Pulse: (!) 109  Temp: 98.5 F (36.9 C)  Weight: 206 lb 6.4 oz (93.6 kg)    Fetal Status: Fetal Heart Rate (bpm): 130 Fundal Height: 27 cm Movement: Present     General:  Alert, oriented and cooperative. Patient is in no acute distress.  Skin: Skin is warm and dry. No rash noted.   Cardiovascular: Normal heart rate noted  Respiratory: Normal respiratory effort, no problems with respiration noted  Abdomen: Soft, gravid, appropriate for gestational age.  Pain/Pressure: Absent     Pelvic: Cervical exam deferred        Extremities: Normal range of motion.  Edema: None  Mental Status: Normal mood and affect. Normal behavior. Normal judgment and thought content.   Assessment and Plan:  Pregnancy: G3P2002 at [redacted]w[redacted]d  1. Supervision of normal intrauterine pregnancy in multigravida in first trimester Not working. Feels well. Here with young daughter Reviewed 01/18/21 anatomy u/s with no previa, posterior placenta, AFI wnl, anatomy wnl except for equivocal left pyelectasis 7  mm, 3VC, recommend f/u after 33 wks Quad neg 11/30/20 1 hour glucola today - Hemoglobin, venipuncture - HIV-1/HIV-2 Qualitative RNA - RPR - Glucose, 1 hour gestational  2. Placenta previa in second trimester Previa resolved on 01/18/21 u/s  3. Fetal abnormality during pregnancy, antepartum, single or unspecified fetus Left fetal pyelectasis continues on 01/18/21 u/s 47mm with recommended f/u at 33 wks  4. Obesity affecting pregnancy, antepartum 11 lb 6.4 oz (5.171 kg) Not taking ASA 81 mg  5. 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 Please refer to After Visit Summary for other counseling recommendations.  No follow-ups on file.  No future appointments.  Alberteen Spindle, CNM

## 2021-02-07 LAB — RPR: RPR Ser Ql: NONREACTIVE

## 2021-02-07 LAB — GLUCOSE, 1 HOUR GESTATIONAL: Gestational Diabetes Screen: 133 mg/dL (ref 65–139)

## 2021-02-07 LAB — HIV-1/HIV-2 QUALITATIVE RNA
HIV-1 RNA, Qualitative: NONREACTIVE
HIV-2 RNA, Qualitative: NONREACTIVE

## 2021-02-20 ENCOUNTER — Ambulatory Visit: Payer: Self-pay

## 2021-02-20 ENCOUNTER — Ambulatory Visit: Payer: Self-pay | Admitting: Advanced Practice Midwife

## 2021-02-20 ENCOUNTER — Other Ambulatory Visit: Payer: Self-pay

## 2021-02-20 VITALS — BP 124/73 | HR 90 | Temp 98.3°F | Wt 205.0 lb

## 2021-02-20 DIAGNOSIS — O4402 Placenta previa specified as without hemorrhage, second trimester: Secondary | ICD-10-CM

## 2021-02-20 DIAGNOSIS — Z3483 Encounter for supervision of other normal pregnancy, third trimester: Secondary | ICD-10-CM

## 2021-02-20 DIAGNOSIS — O4403 Placenta previa specified as without hemorrhage, third trimester: Secondary | ICD-10-CM

## 2021-02-20 DIAGNOSIS — O359XX0 Maternal care for (suspected) fetal abnormality and damage, unspecified, not applicable or unspecified: Secondary | ICD-10-CM

## 2021-02-20 DIAGNOSIS — O99213 Obesity complicating pregnancy, third trimester: Secondary | ICD-10-CM

## 2021-02-20 DIAGNOSIS — O99210 Obesity complicating pregnancy, unspecified trimester: Secondary | ICD-10-CM

## 2021-02-20 DIAGNOSIS — Z3481 Encounter for supervision of other normal pregnancy, first trimester: Secondary | ICD-10-CM

## 2021-02-20 NOTE — Progress Notes (Signed)
   PRENATAL VISIT NOTE  Subjective:  Savannah James is a 20 y.o. G3P2002 at [redacted]w[redacted]d being seen today for ongoing prenatal care.  She is currently monitored for the following issues for this low-risk pregnancy and has Obesity affecting pregnancy BMI=36.3; Breast mass, left; Supervision of normal intrauterine pregnancy in multigravida in first trimester; Placenta previa on 11/14/20 u/s @ 14 6/7: NO previa on 01/18/21 u/s; and Fetal abnormality during pregnancy, antepartum left pyelectasis on 01/18/21 u/s on their problem list.  Patient reports fatigue.  Contractions: Not present. Vag. Bleeding: None.  Movement: Present. Denies leaking of fluid/ROM.   The following portions of the patient's history were reviewed and updated as appropriate: allergies, current medications, past family history, past medical history, past social history, past surgical history and problem list. Problem list updated.  Objective:   Vitals:   02/20/21 1006  BP: 124/73  Pulse: 90  Temp: 98.3 F (36.8 C)  Weight: 205 lb (93 kg)    Fetal Status: Fetal Heart Rate (bpm): 160 Fundal Height: 30 cm Movement: Present     General:  Alert, oriented and cooperative. Patient is in no acute distress.  Skin: Skin is warm and dry. No rash noted.   Cardiovascular: Normal heart rate noted  Respiratory: Normal respiratory effort, no problems with respiration noted  Abdomen: Soft, gravid, appropriate for gestational age.  Pain/Pressure: Absent     Pelvic: Cervical exam deferred        Extremities: Normal range of motion.  Edema: None  Mental Status: Normal mood and affect. Normal behavior. Normal judgment and thought content.   Assessment and Plan:  Pregnancy: G3P2002 at [redacted]w[redacted]d  1. Supervision of normal intrauterine pregnancy in multigravida in first trimester 1 hour glucola=133 wnl C/o fatigue. Not working.  Reviewed 01/18/21 u/s for f/u anatomy with no previa, AFI wnl, 3VC, normal bladder at 23 3/7 with equivocal left  pyelectasis. Needs f/u u/s at 33 wks--ordered 1 hour glucola=133  2. Obesity affecting pregnancy, antepartum 10 lb (4.536 kg) Not taking ASA 81 mg daily Walking 3x/wk x 15-20 min  3. Fetal abnormality during pregnancy, antepartum, single or unspecified fetus Fetal left pyelectasis on 01/18/21 u/s with normal bladder and AFI. Needs f/u at 33 wks--ordered - US OB Limited; Future  4. Placenta previa in second trimester No previa on 01/18/21 u/s   Preterm labor symptoms and general obstetric precautions including but not limited to vaginal bleeding, contractions, leaking of fluid and fetal movement were reviewed in detail with the patient. Please refer to After Visit Summary for other counseling recommendations.  Return in about 2 weeks (around 03/06/2021) for routine PNC.  Future Appointments  Date Time Provider Department Center  03/06/2021  2:00 PM [facility] PROVIDER [facility] None    Alberteen Spindle, CNM

## 2021-02-20 NOTE — Progress Notes (Signed)
Here today for 29.0 week MH RV. Taking PNV QD. Denies ED/hospital visits since last RV. Kept 01/18/21 [place] appt., states "I need another utlrasound appt." Tawny Hopping, RN

## 2021-02-21 ENCOUNTER — Other Ambulatory Visit: Payer: Self-pay | Admitting: Advanced Practice Midwife

## 2021-02-22 ENCOUNTER — Other Ambulatory Visit: Payer: Self-pay | Admitting: Advanced Practice Midwife

## 2021-02-22 ENCOUNTER — Telehealth: Payer: Self-pay

## 2021-02-22 NOTE — Telephone Encounter (Signed)
[facility] [place] scheduled for 03/20/2021 at 1030 (arrive 1015). Call to client with [facility] ID # [id number] and left message to call [facility] 02/25/2021 for [place] appt. Number to call provided. Jossie Ng, RN

## 2021-02-25 NOTE — Telephone Encounter (Signed)
Call to client with Savannah James and [place] appt provided. Verified client aware of [place] facility location.Jossie Ng, RN

## 2021-03-06 ENCOUNTER — Other Ambulatory Visit: Payer: Self-pay

## 2021-03-06 ENCOUNTER — Ambulatory Visit: Payer: Self-pay | Admitting: Family Medicine

## 2021-03-06 VITALS — BP 126/69 | HR 90 | Temp 99.1°F | Wt 208.0 lb

## 2021-03-06 DIAGNOSIS — Z3481 Encounter for supervision of other normal pregnancy, first trimester: Secondary | ICD-10-CM

## 2021-03-06 DIAGNOSIS — O99213 Obesity complicating pregnancy, third trimester: Secondary | ICD-10-CM

## 2021-03-06 DIAGNOSIS — O99210 Obesity complicating pregnancy, unspecified trimester: Secondary | ICD-10-CM

## 2021-03-06 DIAGNOSIS — Z3483 Encounter for supervision of other normal pregnancy, third trimester: Secondary | ICD-10-CM

## 2021-03-06 NOTE — Progress Notes (Signed)
   PRENATAL VISIT NOTE  Subjective:  Savannah James is a 20 y.o. G3P2002 at [redacted]w[redacted]d being seen today for ongoing prenatal care.  She is currently monitored for the following issues for this low-risk pregnancy and has Obesity affecting pregnancy BMI=36.3; Breast mass, left; Supervision of normal intrauterine pregnancy in multigravida in first trimester; Placenta previa on 11/14/20 u/s @ 14 6/7: NO previa on 01/18/21 u/s; and Fetal abnormality during pregnancy, antepartum left pyelectasis on 01/18/21 u/s on their problem list.  Patient reports  decrease in fetal movement .  Contractions: Irritability. Vag. Bleeding: None.  Movement: Present. Denies leaking of fluid/ROM.   The following portions of the patient's history were reviewed and updated as appropriate: allergies, current medications, past family history, past medical history, past social history, past surgical history and problem list. Problem list updated.  Objective:   Vitals:   03/06/21 1355  BP: 126/69  Pulse: 90  Temp: 99.1 F (37.3 C)  Weight: 208 lb (94.3 kg)    Fetal Status: Fetal Heart Rate (bpm): 152 Fundal Height: 33 cm Movement: Present     General:  Alert, oriented and cooperative. Patient is in no acute distress.  Skin: Skin is warm and dry. No rash noted.   Cardiovascular: Normal heart rate noted  Respiratory: Normal respiratory effort, no problems with respiration noted  Abdomen: Soft, gravid, appropriate for gestational age.  Pain/Pressure: Absent     Pelvic: Cervical exam deferred        Extremities: Normal range of motion.  Edema: Trace  Mental Status: Normal mood and affect. Normal behavior. Normal judgment and thought content.   Assessment and Plan:  Pregnancy: G3P2002 at [redacted]w[redacted]d  1. Supervision of normal intrauterine pregnancy in multigravida in first trimester Patent concerned about decrease fetal movement compared to the last two weeks.  Reports that baby is moving well over 10 x per day.   -Discussed  change in size and room for movement, hydration and laying for 30 mins still to concentrate on movement if feels baby is not moving.  If still concerned to call to inform clinic.   -As part of routine education we reviewed s/sx of pre-eclampsia and to seek medical care ASAP if present.   -Informed of U/S appointment on 03/20/21 2 10:30   2. Obesity affecting pregnancy, antepartum Discussed exercise and diet while pregnant    Preterm labor symptoms and general obstetric precautions including but not limited to vaginal bleeding, contractions, leaking of fluid and fetal movement were reviewed in detail with the patient. Please refer to After Visit Summary for other counseling recommendations.  Return in about 2 weeks (around 03/20/2021) for routine prenatal care.  Future Appointments  Date Time Provider Department Center  03/20/2021 10:30 AM [facility]  03/20/2021  4:00 PM [facility] PROVIDER [facility] None    Wendi Snipes, FNP

## 2021-03-20 ENCOUNTER — Other Ambulatory Visit: Payer: Self-pay

## 2021-03-20 ENCOUNTER — Ambulatory Visit: Payer: Self-pay | Admitting: Family Medicine

## 2021-03-20 ENCOUNTER — Ambulatory Visit
Admission: RE | Admit: 2021-03-20 | Discharge: 2021-03-20 | Disposition: A | Discharge status: Home / Self Care | Payer: Self-pay | Source: Ambulatory Visit | Attending: Advanced Practice Midwife | Admitting: Advanced Practice Midwife

## 2021-03-20 VITALS — BP 115/74 | HR 94 | Temp 98.2°F | Wt 208.0 lb

## 2021-03-20 DIAGNOSIS — Z3481 Encounter for supervision of other normal pregnancy, first trimester: Secondary | ICD-10-CM

## 2021-03-20 DIAGNOSIS — Z3483 Encounter for supervision of other normal pregnancy, third trimester: Secondary | ICD-10-CM

## 2021-03-20 DIAGNOSIS — O359XX0 Maternal care for (suspected) fetal abnormality and damage, unspecified, not applicable or unspecified: Secondary | ICD-10-CM | POA: Insufficient documentation

## 2021-03-20 LAB — HEMOGLOBIN, FINGERSTICK: Hemoglobin: 12.3 g/dL (ref 11.1–15.9)

## 2021-03-20 MED ORDER — PRENATAL VITAMINS 28-0.8 MG PO TABS: 1.0000 | ORAL_TABLET | Freq: Every day | ORAL | 0 refills | Status: AC

## 2021-03-20 NOTE — Progress Notes (Signed)
   PRENATAL VISIT NOTE  Subjective:  Savannah James is a 20 y.o. G3P2002 at [redacted]w[redacted]d being seen today for ongoing prenatal care.  She is currently monitored for the following issues for this low-risk pregnancy and has Obesity affecting pregnancy BMI=36.3; Breast mass, left; Supervision of normal intrauterine pregnancy in multigravida in first trimester; Placenta previa on 11/14/20 u/s @ 14 6/7: NO previa on 01/18/21 u/s; and Fetal abnormality during pregnancy, antepartum left pyelectasis on 01/18/21 u/s on their problem list.  Patient reports no complaints.  Contractions: Irritability. Vag. Bleeding: None.  Movement: Present. Denies leaking of fluid/ROM.   The following portions of the patient's history were reviewed and updated as appropriate: allergies, current medications, past family history, past medical history, past social history, past surgical history and problem list. Problem list updated.  Objective:   Vitals:   03/20/21 1600  BP: 115/74  Pulse: 94  Temp: 98.2 F (36.8 C)  Weight: 208 lb (94.3 kg)    Fetal Status: Fetal Heart Rate (bpm): 150 Fundal Height: 35 cm Movement: Present  Presentation: Vertex  General:  Alert, oriented and cooperative. Patient is in no acute distress.  Skin: Skin is warm and dry. No rash noted.   Cardiovascular: Normal heart rate noted  Respiratory: Normal respiratory effort, no problems with respiration noted  Abdomen: Soft, gravid, appropriate for gestational age.  Pain/Pressure: Absent     Pelvic: Cervical exam deferred        Extremities: Normal range of motion.  Edema: Trace  Mental Status: Normal mood and affect. Normal behavior. Normal judgment and thought content.   Assessment and Plan:  Pregnancy: G3P2002 at 103w0d  1. Supervision of normal intrauterine pregnancy in multigravida in first trimester - discussed fetal movement, baby moving fine,  - [place] today, report not ready at this time for provider review.   -anticipated guidance  starting at 35 weeks, weekly PNV, labs at 36 weeks.  -Hbg checked to day last was 11.7 at 6/1 appointment.   - Prenatal Vit-Fe Fumarate-FA (PRENATAL VITAMINS) 28-0.8 MG TABS; Take 1 tablet by mouth daily.  Dispense: 100 tablet; Refill: 0 - Hemoglobin, venipuncture   Preterm labor symptoms and general obstetric precautions including but not limited to vaginal bleeding, contractions, leaking of fluid and fetal movement were reviewed in detail with the patient. Please refer to After Visit Summary for other counseling recommendations.   Return in about 2 weeks (around 04/03/2021) for routine prenatal care.  No future appointments.  Wendi Snipes, FNP

## 2021-03-20 NOTE — Progress Notes (Signed)
Hgb reviewed during clinic visit.   Appointment reminder given for next MH RV.   Floy Sabina, RN

## 2021-03-20 NOTE — Progress Notes (Signed)
Here today for 33.0 week MH RV. Taking PNV QD. Denies ED/hospital visit since last RV. Kept today's follow up ultrasound at [facility]. Additional PNV's given. Tawny Hopping, RN

## 2021-03-22 ENCOUNTER — Encounter: Payer: Self-pay | Admitting: Advanced Practice Midwife

## 2021-04-03 ENCOUNTER — Other Ambulatory Visit: Payer: Self-pay

## 2021-04-03 ENCOUNTER — Ambulatory Visit: Payer: Self-pay | Admitting: Advanced Practice Midwife

## 2021-04-03 VITALS — BP 122/73 | HR 86 | Temp 98.1°F | Wt 208.8 lb

## 2021-04-03 DIAGNOSIS — Z3483 Encounter for supervision of other normal pregnancy, third trimester: Secondary | ICD-10-CM

## 2021-04-03 DIAGNOSIS — Z3481 Encounter for supervision of other normal pregnancy, first trimester: Secondary | ICD-10-CM

## 2021-04-03 DIAGNOSIS — O359XX0 Maternal care for (suspected) fetal abnormality and damage, unspecified, not applicable or unspecified: Secondary | ICD-10-CM

## 2021-04-03 DIAGNOSIS — O99210 Obesity complicating pregnancy, unspecified trimester: Secondary | ICD-10-CM

## 2021-04-03 DIAGNOSIS — O99213 Obesity complicating pregnancy, third trimester: Secondary | ICD-10-CM

## 2021-04-03 NOTE — Progress Notes (Signed)
Patient here for MH RV at 34 67. Here with 2 daughters.Burt Knack, RN

## 2021-04-03 NOTE — Progress Notes (Signed)
   PRENATAL VISIT NOTE  Subjective:  Savannah James is a 20 y.o. G3P2002 at [redacted]w[redacted]d being seen today for ongoing prenatal care.  She is currently monitored for the following issues for this low-risk pregnancy and has Obesity affecting pregnancy BMI=36.3; Breast mass, left; Supervision of normal intrauterine pregnancy in multigravida in first trimester; Placenta previa on 11/14/20 u/s @ 14 6/7: NO previa on 01/18/21 u/s; and Fetal abnormality during pregnancy, antepartum left pyelectasis on 01/18/21 u/s on their problem list.  Patient reports no complaints.  Contractions: Not present. Vag. Bleeding: None.  Movement: Present. Denies leaking of fluid/ROM.   The following portions of the patient's history were reviewed and updated as appropriate: allergies, current medications, past family history, past medical history, past social history, past surgical history and problem list. Problem list updated.  Objective:   Vitals:   04/03/21 1109  BP: 122/73  Pulse: 86  Temp: 98.1 F (36.7 C)  Weight: 208 lb 12.8 oz (94.7 kg)    Fetal Status: Fetal Heart Rate (bpm): 140 Fundal Height: 37 cm Movement: Present  Presentation: Vertex  General:  Alert, oriented and cooperative. Patient is in no acute distress.  Skin: Skin is warm and dry. No rash noted.   Cardiovascular: Normal heart rate noted  Respiratory: Normal respiratory effort, no problems with respiration noted  Abdomen: Soft, gravid, appropriate for gestational age.  Pain/Pressure: Absent     Pelvic: Cervical exam deferred        Extremities: Normal range of motion.  Edema: None  Mental Status: Normal mood and affect. Normal behavior. Normal judgment and thought content.   Assessment and Plan:  Pregnancy: G3P2002 at [redacted]w[redacted]d  1. Obesity affecting pregnancy, antepartum 13 lb 12.8 oz (6.26 kg) Not exercising Not taking ASA 81 mg   2. Supervision of normal intrauterine pregnancy in multigravida in first trimester Not working. Has car seat  and ready for baby at home. Here with 2 young daughters Reviewed 03/20/21 u/s at 33.0 wks with no previa, posterior placenta, bladder wnl, EFW=41%, mild right renal pyelectasis (7 mm) previously 2.6 mm. Proably physiologic. Discussed with pt to ask peds for f/u pp  3. Fetal abnormality during pregnancy, antepartum, single or unspecified fetus See above note   Preterm labor symptoms and general obstetric precautions including but not limited to vaginal bleeding, contractions, leaking of fluid and fetal movement were reviewed in detail with the patient. Please refer to After Visit Summary for other counseling recommendations.  Return in about 2 weeks (around 04/17/2021) for routine PNC.  Future Appointments  Date Time Provider Department Center  04/11/2021 11:00 AM [facility] PROVIDER [facility] None    Alberteen Spindle, CNM

## 2021-04-11 ENCOUNTER — Encounter: Payer: Self-pay | Admitting: Physician Assistant

## 2021-04-11 ENCOUNTER — Other Ambulatory Visit: Payer: Self-pay

## 2021-04-11 ENCOUNTER — Ambulatory Visit: Payer: Self-pay | Admitting: Physician Assistant

## 2021-04-11 VITALS — BP 115/82 | HR 83 | Temp 97.2°F | Wt 208.8 lb

## 2021-04-11 DIAGNOSIS — Z3481 Encounter for supervision of other normal pregnancy, first trimester: Secondary | ICD-10-CM

## 2021-04-11 DIAGNOSIS — O99213 Obesity complicating pregnancy, third trimester: Secondary | ICD-10-CM

## 2021-04-11 DIAGNOSIS — O99210 Obesity complicating pregnancy, unspecified trimester: Secondary | ICD-10-CM

## 2021-04-11 DIAGNOSIS — Z3483 Encounter for supervision of other normal pregnancy, third trimester: Secondary | ICD-10-CM

## 2021-04-11 NOTE — Progress Notes (Signed)
   PRENATAL VISIT NOTE  Subjective:  Savannah James is a 20 y.o. G3P2002 at [redacted]w[redacted]d being seen today for ongoing prenatal care.  She is currently monitored for the following issues for this low-risk pregnancy and has Obesity affecting pregnancy BMI=36.3; Breast mass, left; Supervision of normal intrauterine pregnancy in multigravida in first trimester; Placenta previa on 11/14/20 u/s @ 14 6/7: NO previa on 01/18/21 u/s; and Fetal abnormality during pregnancy, antepartum left pyelectasis on 01/18/21 u/s on their problem list.  Patient reports occasional contractions.  Contractions: Irregular. Vag. Bleeding: None.  Movement: Present. Has some pelvic pressure at times. Denies leaking of fluid/ROM.   The following portions of the patient's history were reviewed and updated as appropriate: allergies, current medications, past family history, past medical history, past social history, past surgical history and problem list. Problem list updated.  Objective:   Vitals:   04/11/21 1115  BP: 115/82  Pulse: 83  Temp: (!) 97.2 F (36.2 C)  Weight: 208 lb 12.8 oz (94.7 kg)    Fetal Status: Fetal Heart Rate (bpm): 152 Fundal Height: 38 cm Movement: Present     General:  Alert, oriented and cooperative. Patient is in no acute distress.  Skin: Skin is warm and dry. No rash noted.   Cardiovascular: Normal heart rate noted  Respiratory: Normal respiratory effort, no problems with respiration noted  Abdomen: Soft, gravid, appropriate for gestational age.  Pain/Pressure: Absent     Pelvic: Cervical exam performed        Extremities: Normal range of motion.  Edema: None  Mental Status: Normal mood and affect. Normal behavior. Normal judgment and thought content.   Assessment and Plan:  Pregnancy: G3P2002 at [redacted]w[redacted]d  1. Supervision of normal intrauterine pregnancy in multigravida in first trimester Checked cervix at pt request: mid/ant, closed, thick. Routine rectovaginal testing today. Enc po hydration  in light of very hot weather. - GBS Culture - Chlamydia/GC NAA, Confirmation  2. Obesity affecting pregnancy, antepartum Has not been taking aspirin. No indication moving forward. 13 lb TWG is appropriate.   Preterm labor symptoms and general obstetric precautions including but not limited to vaginal bleeding, contractions, leaking of fluid and fetal movement were reviewed in detail with the patient. Please refer to After Visit Summary for other counseling recommendations.  Return in about 1 week (around 04/18/2021) for Routine prenatal care.  Future Appointments  Date Time Provider Department Center  04/18/2021 11:00 AM [facility] PROVIDER [facility] None    Landry Dyke, PA-C

## 2021-04-11 NOTE — Progress Notes (Signed)
Here today for 36.0 week MH RV. Taking PNV QD. Denies ED/hospital visits since last RV. 36 week labs and packet today. Patient declines to self collect specimens. Tawny Hopping, RN

## 2021-04-13 LAB — CHLAMYDIA/GC NAA, CONFIRMATION
Chlamydia trachomatis, NAA: NEGATIVE
Neisseria gonorrhoeae, NAA: NEGATIVE

## 2021-04-15 LAB — CULTURE, BETA STREP (GROUP B ONLY): Strep Gp B Culture: NEGATIVE

## 2021-04-18 ENCOUNTER — Other Ambulatory Visit: Payer: Self-pay

## 2021-04-18 ENCOUNTER — Ambulatory Visit: Payer: Self-pay | Admitting: Family Medicine

## 2021-04-18 VITALS — BP 113/73 | HR 78 | Temp 97.4°F | Wt 210.2 lb

## 2021-04-18 DIAGNOSIS — O359XX0 Maternal care for (suspected) fetal abnormality and damage, unspecified, not applicable or unspecified: Secondary | ICD-10-CM

## 2021-04-18 DIAGNOSIS — Z3483 Encounter for supervision of other normal pregnancy, third trimester: Secondary | ICD-10-CM

## 2021-04-18 DIAGNOSIS — O99213 Obesity complicating pregnancy, third trimester: Secondary | ICD-10-CM

## 2021-04-18 DIAGNOSIS — Z3481 Encounter for supervision of other normal pregnancy, first trimester: Secondary | ICD-10-CM

## 2021-04-18 NOTE — Progress Notes (Signed)
   PRENATAL VISIT NOTE  Subjective:  Savannah James is a 20 y.o. G3P2002 at [redacted]w[redacted]d being seen today for ongoing prenatal care.  She is currently monitored for the following issues for this high-risk pregnancy and has Obesity affecting pregnancy BMI=36.3; Breast mass, left; Supervision of normal intrauterine pregnancy in multigravida in first trimester; Placenta previa on 11/14/20 u/s @ 14 6/7: NO previa on 01/18/21 u/s; and Fetal abnormality during pregnancy, antepartum left pyelectasis on 01/18/21 u/s on their problem list.  Patient reports no complaints.  Contractions: Irregular. Vag. Bleeding: None.  Movement: Present. Denies leaking of fluid/ROM.   The following portions of the patient's history were reviewed and updated as appropriate: allergies, current medications, past family history, past medical history, past social history, past surgical history and problem list. Problem list updated.  Objective:   Vitals:   04/18/21 1131  BP: 113/73  Pulse: 78  Temp: (!) 97.4 F (36.3 C)  Weight: 210 lb 3.2 oz (95.3 kg)    Fetal Status: Fetal Heart Rate (bpm): 150 Fundal Height: 39 cm Movement: Present  Presentation: Vertex  General:  Alert, oriented and cooperative. Patient is in no acute distress.  Skin: Skin is warm and dry. No rash noted.   Cardiovascular: Normal heart rate noted  Respiratory: Normal respiratory effort, no problems with respiration noted  Abdomen: Soft, gravid, appropriate for gestational age.  Pain/Pressure: Absent     Pelvic: Cervical exam deferred        Extremities: Normal range of motion.  Edema: None  Mental Status: Normal mood and affect. Normal behavior. Normal judgment and thought content.   Assessment and Plan:  Pregnancy: G3P2002 at [redacted]w[redacted]d  1. Obesity affecting pregnancy in third trimester TWG=15 lb 3.2 oz (6.895 kg) which is at goal  2. Supervision of normal intrauterine pregnancy in multigravida in first trimester Up to date Previa resolved by July  [place] Await labor Reviewed when to seek care Baby ready at home  3. Fetal abnormality during pregnancy, antepartum, single or unspecified fetus Fetal pyelectasis 01/2021 [place].    Term labor symptoms and general obstetric precautions including but not limited to vaginal bleeding, contractions, leaking of fluid and fetal movement were reviewed in detail with the patient. Please refer to After Visit Summary for other counseling recommendations.  Return in about 1 week (around 04/25/2021) for Routine prenatal care.  Future Appointments  Date Time Provider Department Center  04/25/2021  9:40 AM [facility] PROVIDER [facility] None    Federico Flake, MD

## 2021-04-18 NOTE — Progress Notes (Signed)
Patient here for MH RV at 37 weeks. 36 week labs done last visit.Burt Knack, RN

## 2021-04-22 ENCOUNTER — Encounter: Payer: Self-pay | Admitting: Obstetrics and Gynecology

## 2021-04-22 ENCOUNTER — Other Ambulatory Visit: Payer: Self-pay

## 2021-04-22 ENCOUNTER — Telehealth: Payer: Self-pay | Admitting: Family Medicine

## 2021-04-22 ENCOUNTER — Observation Stay
Admission: EM | Admit: 2021-04-22 | Discharge: 2021-04-22 | Disposition: A | Discharge status: Home / Self Care | Payer: Self-pay | Attending: Obstetrics and Gynecology | Admitting: Obstetrics and Gynecology

## 2021-04-22 DIAGNOSIS — Z3A37 37 weeks gestation of pregnancy: Secondary | ICD-10-CM | POA: Insufficient documentation

## 2021-04-22 DIAGNOSIS — O368131 Decreased fetal movements, third trimester, fetus 1: Principal | ICD-10-CM | POA: Insufficient documentation

## 2021-04-22 NOTE — OB Triage Note (Addendum)
Pt is  G3P2 that is [redacted]w[redacted]d, presents with complaints of DFM since this am. Patient states that baby girl is normally very active and she was concerned. FHT 155. Monitors applied and assessing. No bleeding or LOF. Patient does complain of pain and tightening in lower abdomen, she states that she believes it is normal in pregnancy but wanted to mention it. She states that with the tightening, she feels a burning. She rates her abd pain a 7/10 on 0-10 pain scale. She states that she took a tylenol last night about 2300 and it helped some with the pain, but not much. No burning with urination. Patient states that she has had a headache, mainly right sided since 0720am that she then rated a 8/10. Patient states that her headache is a 6/10 now. VSS

## 2021-04-22 NOTE — Progress Notes (Signed)
Patient discharged home. Reviewed discharge instructions. Patient verbalizes understanding of discharge instructions.  Patient ambulatory in good condition.

## 2021-04-22 NOTE — Discharge Summary (Signed)
Savannah James is a 20 y.o. female. She is at [redacted]w[redacted]d gestation. Patient's last menstrual period was 08/01/2020. Estimated Date of Delivery: 05/09/21  Prenatal care site: [facility]  Chief complaint: decreased fetal movement  HPI: Savannah James presents to L&D with complaints of decreased FM  S: Resting comfortably. no CTX, no VB.no LOF,  Active fetal movement.   Maternal Medical History:  Past Medical Hx:  has a past medical history of Medical history non-contributory, Patient denies medical problems, and Vaginal Pap smear, abnormal.    Past Surgical Hx:  has a past surgical history that includes denies.   No Known Allergies   Prior to Admission medications   Medication Sig Start Date End Date Taking? Authorizing Provider  Prenatal Vit-Fe Fumarate-FA (PRENATAL VITAMINS) 28-0.8 MG TABS Take 1 tablet by mouth daily. 03/20/21 06/28/21 Yes Federico Flake, MD  Prenatal Vit-Fe Fumarate-FA (MULTIVITAMIN-PRENATAL) 27-0.8 MG TABS tablet Take 1 tablet by mouth daily at 12 noon. Patient not taking: No sig reported    [provider]    Social History: She  reports that she has never smoked. She has never used smokeless tobacco. She reports that she does not currently use alcohol after a past usage of about 6.0 standard drinks per week. She reports that she does not use drugs.  Family History: family history includes Cancer in her brother; Hepatitis in her brother; Hypertension in her mother; Liver cancer in her brother. ,no history of gyn cancers  Review of Systems: A full review of systems was performed and negative except as noted in the HPI.    O:  BP 121/74 (BP Location: Left Arm)   Pulse 98   Temp 98.6 F (37 C) (Oral)   Resp 18   Ht 5\' 3"  (1.6 m)   Wt 94.3 kg   LMP 08/01/2020 Comment: normal period  BMI 36.85 kg/m  No results found for this or any previous visit (from the past 48 hour(s)).   Constitutional: NAD, AAOx3  HE/ENT: extraocular movements grossly intact, moist  mucous membranes CV: RRR PULM: nl respiratory effort, CTABL Abd: gravid, non-tender, non-distended, soft  Ext: Non-tender, Nonedmeatous Psych: mood appropriate, speech normal Pelvic : deferred SVE:   deferred  Fetal Monitor: Baseline: 155 bpm Variability: moderate Accels: Present Decels: none Toco: none  Category: I   Assessment: 20 y.o. [redacted]w[redacted]d here for antenatal surveillance during pregnancy.  Principle diagnosis: Decreased FM- Reassuring fetal status There were no encounter diagnoses.   Plan:  Fetal Wellbeing: Reassuring Cat 1 tracing. Reactive NST  Reviewed fetal kick counts,feeling good fetal movement prior to discharge D/c home stable, precautions reviewed, follow-up as scheduled.   ----- [redacted]w[redacted]d, CNM Certified Nurse Midwife [facility]

## 2021-04-22 NOTE — Telephone Encounter (Signed)
Patient feeling nauseous and really bad headache wants to know if that's normal.

## 2021-04-22 NOTE — Telephone Encounter (Signed)
Per consult with E. Sciora CNM, encourage client to take 2 extra strength Tylenol with a meal and nap for 1 - 1.5 hours after determining when baby most active.  Per client, baby usually most active in the mornings. As not the case this am, recommend L & D evaluation now. Client verbalized understanding of need for L & D evaluation now. Jossie Ng, RN

## 2021-04-22 NOTE — Telephone Encounter (Signed)
EGA = 37 4/7. Client with c/o "hard" left forehead pain that began last pm at 2200 in addition to nausea without emesis during night. Emesis x1 this am after initially attempting breakfast. Took one Tylenol and headache pain "not quite as hard as last night". Later in am able to tolerate eggs, one slice of toast with pineapple jelly and orange juice. Denies amy visual changes (lines, floaters, spots, pinpoints of light). Reports baby is moving more slowly this am than usual.. States belly gets hard occasionally. Denies changes in vaginal discharge. Jossie Ng, RN

## 2021-04-25 ENCOUNTER — Ambulatory Visit: Payer: Self-pay | Admitting: Physician Assistant

## 2021-04-25 ENCOUNTER — Encounter: Payer: Self-pay | Admitting: Physician Assistant

## 2021-04-25 ENCOUNTER — Other Ambulatory Visit: Payer: Self-pay

## 2021-04-25 VITALS — BP 123/88 | HR 97 | Temp 97.8°F | Wt 212.0 lb

## 2021-04-25 DIAGNOSIS — O99210 Obesity complicating pregnancy, unspecified trimester: Secondary | ICD-10-CM

## 2021-04-25 DIAGNOSIS — O99213 Obesity complicating pregnancy, third trimester: Secondary | ICD-10-CM

## 2021-04-25 DIAGNOSIS — Z3481 Encounter for supervision of other normal pregnancy, first trimester: Secondary | ICD-10-CM

## 2021-04-25 DIAGNOSIS — Z3483 Encounter for supervision of other normal pregnancy, third trimester: Secondary | ICD-10-CM

## 2021-04-25 NOTE — Progress Notes (Signed)
   PRENATAL VISIT NOTE  Subjective:  Savannah James is a 20 y.o. G3P2002 at [redacted]w[redacted]d being seen today for ongoing prenatal care.  She is currently monitored for the following issues for this high-risk pregnancy and has Obesity affecting pregnancy BMI=36.3; Breast mass, left; Supervision of normal intrauterine pregnancy in multigravida in first trimester; Placenta previa on 11/14/20 u/s @ 14 6/7: NO previa on 01/18/21 u/s; Fetal abnormality during pregnancy, antepartum left pyelectasis on 01/18/21 u/s; and Indication for care in labor or delivery on their problem list.  Patient reports nausea and occasional contractions.  Contractions: Irregular. Vag. Bleeding: None.  Movement: Present. Denies leaking of fluid/ROM.   The following portions of the patient's history were reviewed and updated as appropriate: allergies, current medications, past family history, past medical history, past social history, past surgical history and problem list. Problem list updated.  Objective:   Vitals:   04/25/21 0926  BP: 123/88  Pulse: 97  Temp: 97.8 F (36.6 C)  Weight: 212 lb (96.2 kg)    Fetal Status: Fetal Heart Rate (bpm): 136 Fundal Height: 40 cm Movement: Present  Presentation: Vertex  General:  Alert, oriented and cooperative. Patient is in no acute distress.  Skin: Skin is warm and dry. No rash noted.   Cardiovascular: Normal heart rate noted  Respiratory: Normal respiratory effort, no problems with respiration noted  Abdomen: Soft, gravid, appropriate for gestational age.  Pain/Pressure: Absent     Pelvic: Cervical exam deferred        Extremities: Normal range of motion.  Edema: None  Mental Status: Normal mood and affect. Normal behavior. Normal judgment and thought content.   Assessment and Plan:  Pregnancy: G3P2002 at [redacted]w[redacted]d  1. Supervision of normal intrauterine pregnancy in multigravida in first trimester Went to hosp 8/15 due to decreased fetal movement and had eval with reassuring  results. Good fetal movements since. Continue to monitor, push fluids.  2. Obesity affecting pregnancy, antepartum Appropriate wt gain for pregnancy.   Term labor symptoms and general obstetric precautions including but not limited to vaginal bleeding, contractions, leaking of fluid and fetal movement were reviewed in detail with the patient. Please refer to After Visit Summary for other counseling recommendations.  Return in about 1 week (around 05/02/2021) for Routine prenatal care.  Future Appointments  Date Time Provider Department Center  05/02/2021  8:40 AM [facility] PROVIDER [facility] None    Landry Dyke, PA-C

## 2021-05-02 ENCOUNTER — Encounter: Payer: Self-pay | Admitting: Physician Assistant

## 2021-05-02 ENCOUNTER — Ambulatory Visit: Payer: Self-pay | Admitting: Physician Assistant

## 2021-05-02 ENCOUNTER — Other Ambulatory Visit: Payer: Self-pay

## 2021-05-02 VITALS — BP 115/83 | HR 80 | Temp 97.6°F | Wt 211.0 lb

## 2021-05-02 DIAGNOSIS — O99210 Obesity complicating pregnancy, unspecified trimester: Secondary | ICD-10-CM

## 2021-05-02 DIAGNOSIS — Z3481 Encounter for supervision of other normal pregnancy, first trimester: Secondary | ICD-10-CM

## 2021-05-02 DIAGNOSIS — O99213 Obesity complicating pregnancy, third trimester: Secondary | ICD-10-CM

## 2021-05-02 DIAGNOSIS — O480 Post-term pregnancy: Secondary | ICD-10-CM

## 2021-05-02 DIAGNOSIS — Z3483 Encounter for supervision of other normal pregnancy, third trimester: Secondary | ICD-10-CM

## 2021-05-02 NOTE — Progress Notes (Signed)
   PRENATAL VISIT NOTE  Subjective:  Savannah James is a 20 y.o. G3P2002 at [redacted]w[redacted]d being seen today for ongoing prenatal care.  She is currently monitored for the following issues for this low-risk pregnancy and has Obesity affecting pregnancy BMI=36.3; Breast mass, left; Supervision of normal intrauterine pregnancy in multigravida in first trimester; Placenta previa on 11/14/20 u/s @ 14 6/7: NO previa on 01/18/21 u/s; Fetal abnormality during pregnancy, antepartum left pyelectasis on 01/18/21 u/s; and Indication for care in labor or delivery on their problem list.  Patient reports backache.  Contractions: Not present. Vag. Bleeding: None.  Movement: Present. Denies leaking of fluid/ROM.   The following portions of the patient's history were reviewed and updated as appropriate: allergies, current medications, past family history, past medical history, past social history, past surgical history and problem list. Problem list updated.  Objective:   Vitals:   05/02/21 0828  BP: 115/83  Pulse: 80  Temp: 97.6 F (36.4 C)  Weight: 211 lb (95.7 kg)    Fetal Status: Fetal Heart Rate (bpm): 156 Fundal Height: 41 cm Movement: Present  Presentation: Vertex  General:  Alert, oriented and cooperative. Patient is in no acute distress.  Skin: Skin is warm and dry. No rash noted.   Cardiovascular: Normal heart rate noted  Respiratory: Normal respiratory effort, no problems with respiration noted  Abdomen: Soft, gravid, appropriate for gestational age.  Pain/Pressure: Absent     Pelvic: Cervical exam performed Dilation: Closed Effacement (%): 20    Extremities: Normal range of motion.  Edema: None  Mental Status: Normal mood and affect. Normal behavior. Normal judgment and thought content.   Assessment and Plan:  Pregnancy: G3P2002 at [redacted]w[redacted]d  1. Supervision of normal intrauterine pregnancy in multigravida in first trimester Counseled re: option for IOL at 41-42 wk, pt desires IOL at 24 0/7 (05/16/21)  if not delivered prior. [facility] IOL form completed.  2. Obesity affecting pregnancy, antepartum TWG 16 lb, generally appropriate.   Term labor symptoms and general obstetric precautions including but not limited to vaginal bleeding, contractions, leaking of fluid and fetal movement were reviewed in detail with the patient. Please refer to After Visit Summary for other counseling recommendations.  Return in about 1 week (around 05/09/2021) for Routine prenatal care.  Future Appointments  Date Time Provider Department Center  05/09/2021  8:40 AM [facility] PROVIDER [facility] None    Landry Dyke, PA-C

## 2021-05-02 NOTE — Progress Notes (Signed)
IOL form faxed to [facility] 05/02/21.   Floy Sabina, RN

## 2021-05-02 NOTE — Progress Notes (Signed)
[id number] at [redacted]w[redacted]d, LMP of 08/01/2020, c/w early [place] at [redacted]w[redacted]d.  Scheduled for induction of labor for postdates pregnancy on 05/16/2021 at 0500.   Prenatal provider: [facility] Pregnancy complicated by: Obesity in pregnancy  Fetal renal pyelectasis   Prenatal Labs: Blood type/Rh O pos   Antibody screen neg  Rubella Immune  Varicella Immune  RPR NR  HBsAg Neg  HIV NR  GC neg  Chlamydia neg  Genetic screening AFP neg  1 hour GTT 133  3 hour GTT N/A   GBS Neg   Tdap: given 02/06/2021 Flu: declined  Contraception: Paraguard  Feeding preference: breast and formula   ____ Margaretmary Eddy, CNM Certified Nurse Midwife [facility]

## 2021-05-06 ENCOUNTER — Telehealth: Payer: Self-pay

## 2021-05-06 NOTE — Telephone Encounter (Signed)
Per faxed information from [facility] (sent for scanning), IOL appt is 05/16/2021 at 0500 and client needs Covid testing 05/14/2021. Call to client with Marlene Yemen and above information provided to client. Counseled on where to obtain Covid test. Jossie Ng, RN

## 2021-05-09 ENCOUNTER — Ambulatory Visit: Payer: Self-pay | Admitting: Physician Assistant

## 2021-05-09 ENCOUNTER — Other Ambulatory Visit: Payer: Self-pay

## 2021-05-09 ENCOUNTER — Encounter: Payer: Self-pay | Admitting: Physician Assistant

## 2021-05-09 VITALS — BP 122/78 | HR 87 | Temp 87.4°F | Wt 214.0 lb

## 2021-05-09 DIAGNOSIS — Z3481 Encounter for supervision of other normal pregnancy, first trimester: Secondary | ICD-10-CM

## 2021-05-09 DIAGNOSIS — Z3483 Encounter for supervision of other normal pregnancy, third trimester: Secondary | ICD-10-CM

## 2021-05-09 DIAGNOSIS — O99213 Obesity complicating pregnancy, third trimester: Secondary | ICD-10-CM

## 2021-05-09 DIAGNOSIS — O99210 Obesity complicating pregnancy, unspecified trimester: Secondary | ICD-10-CM

## 2021-05-09 NOTE — Progress Notes (Signed)
   PRENATAL VISIT NOTE  Subjective:  Savannah James is a 20 y.o. G3P2002 at [redacted]w[redacted]d being seen today for ongoing prenatal care.  She is currently monitored for the following issues for this low-risk pregnancy and has Obesity affecting pregnancy BMI=36.3; Breast mass, left; Supervision of normal intrauterine pregnancy in multigravida in first trimester; Placenta previa on 11/14/20 u/s @ 14 6/7: NO previa on 01/18/21 u/s; Fetal abnormality during pregnancy, antepartum left pyelectasis on 01/18/21 u/s; and Indication for care in labor or delivery on their problem list.  Patient reports occasional contractions.  Contractions: Irregular. Vag. Bleeding: None.  Movement: Present. Also has had some thin white discharge this morning, not much, no odor/itch. Denies leaking of fluid/ROM.   The following portions of the patient's history were reviewed and updated as appropriate: allergies, current medications, past family history, past medical history, past social history, past surgical history and problem list. Problem list updated.  Objective:   Vitals:   05/09/21 0846  BP: 122/78  Pulse: 87  Temp: (!) 87.4 F (30.8 C)  Weight: 214 lb (97.1 kg)    Fetal Status: Fetal Heart Rate (bpm): 140 Fundal Height: 40 cm Movement: Present  Presentation: Vertex  General:  Alert, oriented and cooperative. Patient is in no acute distress.  Skin: Skin is warm and dry. No rash noted.   Cardiovascular: Normal heart rate noted  Respiratory: Normal respiratory effort, no problems with respiration noted  Abdomen: Soft, gravid, appropriate for gestational age.  Pain/Pressure: Present     Pelvic: Cervical exam deferred        Extremities: Normal range of motion.  Edema: None  Mental Status: Normal mood and affect. Normal behavior. Normal judgment and thought content.   Assessment and Plan:  Pregnancy: G3P2002 at [redacted]w[redacted]d  1. Supervision of normal intrauterine pregnancy in multigravida in first trimester Term preg. No  f/u visits planned. Reviewed labor sx/procedures, has IOL sched 05/16/21 with preadmit COVID testing planned 05/14/21.  2. Obesity affecting pregnancy, antepartum 19 lb TWG, slightly over recommended.   Term labor symptoms and general obstetric precautions including but not limited to vaginal bleeding, contractions, leaking of fluid and fetal movement were reviewed in detail with the patient. Please refer to After Visit Summary for other counseling recommendations.  Return in about 7 weeks (around 06/27/2021) for routine postpartum exan.  No future appointments.  Landry Dyke, PA-C

## 2021-05-09 NOTE — Progress Notes (Signed)
Patient has IOL scheduled with [facility] under [facility] care on 05/16/21 at 0500am, patient aware.   Patient aware to [facility]id [facility] on 05/14/21 between the hours of 8am-12pm.   Floy Sabina, RN

## 2021-05-13 ENCOUNTER — Encounter: Payer: Self-pay | Admitting: Obstetrics and Gynecology

## 2021-05-13 ENCOUNTER — Other Ambulatory Visit: Payer: Self-pay

## 2021-05-13 ENCOUNTER — Observation Stay
Admission: EM | Admit: 2021-05-13 | Discharge: 2021-05-14 | Disposition: A | Discharge status: Home / Self Care | Payer: Medicaid Other

## 2021-05-13 DIAGNOSIS — O99213 Obesity complicating pregnancy, third trimester: Secondary | ICD-10-CM | POA: Insufficient documentation

## 2021-05-13 DIAGNOSIS — E669 Obesity, unspecified: Secondary | ICD-10-CM | POA: Insufficient documentation

## 2021-05-13 DIAGNOSIS — O471 False labor at or after 37 completed weeks of gestation: Principal | ICD-10-CM | POA: Insufficient documentation

## 2021-05-13 DIAGNOSIS — Z3A40 40 weeks gestation of pregnancy: Secondary | ICD-10-CM | POA: Insufficient documentation

## 2021-05-13 DIAGNOSIS — O479 False labor, unspecified: Secondary | ICD-10-CM | POA: Diagnosis present

## 2021-05-13 DIAGNOSIS — O88113 Amniotic fluid embolism in pregnancy, third trimester: Secondary | ICD-10-CM | POA: Insufficient documentation

## 2021-05-13 LAB — RUPTURE OF MEMBRANE (ROM)PLUS: Rom Plus: NEGATIVE

## 2021-05-13 MED ORDER — ACETAMINOPHEN 500 MG PO TABS
1000.0000 mg | ORAL_TABLET | Freq: Four times a day (QID) | ORAL | Status: DC | PRN
Start: 2021-05-13 — End: 2021-05-14

## 2021-05-13 MED ORDER — CALCIUM CARBONATE ANTACID 500 MG PO CHEW: 2.0000 | CHEWABLE_TABLET | ORAL | Status: DC | PRN

## 2021-05-13 NOTE — OB Triage Note (Signed)
[facility] with complaints of ctx that began around 1600. Pt is  G3P2002, [redacted]w[redacted]d. Pt states ctx feel strong and are about 8-10 min a part. Pt states positive FM. Pt denies vaginal bleeding. Pt states she is leaking a small amount of fluid that is clear. Monitors applied and assessing. Initial FHT 145.

## 2021-05-14 ENCOUNTER — Other Ambulatory Visit
Admission: RE | Admit: 2021-05-14 | Discharge: 2021-05-14 | Disposition: A | Discharge status: Home / Self Care | Payer: Self-pay | Source: Ambulatory Visit

## 2021-05-14 ENCOUNTER — Inpatient Hospital Stay: Payer: Medicaid Other | Admitting: Anesthesiology

## 2021-05-14 ENCOUNTER — Inpatient Hospital Stay
Admission: EM | Admit: 2021-05-14 | Discharge: 2021-05-16 | DRG: 806 | Disposition: A | Discharge status: Home / Self Care | Payer: Medicaid Other | Attending: Obstetrics | Admitting: Obstetrics

## 2021-05-14 ENCOUNTER — Other Ambulatory Visit: Payer: Self-pay

## 2021-05-14 ENCOUNTER — Telehealth: Payer: Self-pay | Admitting: Family Medicine

## 2021-05-14 ENCOUNTER — Encounter: Payer: Self-pay | Admitting: Obstetrics and Gynecology

## 2021-05-14 DIAGNOSIS — O358XX0 Maternal care for other (suspected) fetal abnormality and damage, not applicable or unspecified: Secondary | ICD-10-CM | POA: Diagnosis present

## 2021-05-14 DIAGNOSIS — Z3A40 40 weeks gestation of pregnancy: Secondary | ICD-10-CM

## 2021-05-14 DIAGNOSIS — O9081 Anemia of the puerperium: Secondary | ICD-10-CM | POA: Diagnosis not present

## 2021-05-14 DIAGNOSIS — O479 False labor, unspecified: Secondary | ICD-10-CM | POA: Diagnosis present

## 2021-05-14 DIAGNOSIS — D62 Acute posthemorrhagic anemia: Secondary | ICD-10-CM | POA: Diagnosis not present

## 2021-05-14 DIAGNOSIS — O99214 Obesity complicating childbirth: Secondary | ICD-10-CM | POA: Diagnosis present

## 2021-05-14 DIAGNOSIS — O26893 Other specified pregnancy related conditions, third trimester: Secondary | ICD-10-CM | POA: Diagnosis present

## 2021-05-14 DIAGNOSIS — Z20822 Contact with and (suspected) exposure to covid-19: Secondary | ICD-10-CM | POA: Diagnosis present

## 2021-05-14 DIAGNOSIS — Z01812 Encounter for preprocedural laboratory examination: Secondary | ICD-10-CM | POA: Insufficient documentation

## 2021-05-14 DIAGNOSIS — O480 Post-term pregnancy: Secondary | ICD-10-CM

## 2021-05-14 LAB — CBC
HCT: 38.9 % (ref 36.0–46.0)
Hemoglobin: 14 g/dL (ref 12.0–15.0)
MCH: 31.1 pg (ref 26.0–34.0)
MCHC: 36 g/dL (ref 30.0–36.0)
MCV: 86.4 fL (ref 80.0–100.0)
Platelets: 159 10*3/uL (ref 150–400)
RBC: 4.5 MIL/uL (ref 3.87–5.11)
RDW: 13.7 % (ref 11.5–15.5)
WBC: 9.8 10*3/uL (ref 4.0–10.5)
nRBC: 0 % (ref 0.0–0.2)

## 2021-05-14 LAB — RESP PANEL BY RT-PCR (FLU A&B, COVID) ARPGX2
Influenza A by PCR: NEGATIVE
Influenza B by PCR: NEGATIVE
SARS Coronavirus 2 by RT PCR: NEGATIVE

## 2021-05-14 LAB — TYPE AND SCREEN
ABO/RH(D): O POS
Antibody Screen: NEGATIVE

## 2021-05-14 LAB — SARS CORONAVIRUS 2 (TAT 6-24 HRS): SARS Coronavirus 2: NEGATIVE

## 2021-05-14 LAB — ABO/RH: ABO/RH(D): O POS

## 2021-05-14 MED ORDER — WITCH HAZEL-GLYCERIN EX PADS: 1.0000 "application " | MEDICATED_PAD | CUTANEOUS | Status: DC | PRN

## 2021-05-14 MED ORDER — PHENYLEPHRINE 40 MCG/ML (10ML) SYRINGE FOR IV PUSH (FOR BLOOD PRESSURE SUPPORT): 80.0000 ug | PREFILLED_SYRINGE | INTRAVENOUS | Status: DC | PRN

## 2021-05-14 MED ORDER — OXYTOCIN-SODIUM CHLORIDE 30-0.9 UT/500ML-% IV SOLN: 2.5000 [IU]/h | INTRAVENOUS | Status: DC

## 2021-05-14 MED ORDER — LIDOCAINE HCL (PF) 1 % IJ SOLN: 30.0000 mL | INTRAMUSCULAR | Status: DC | PRN

## 2021-05-14 MED ORDER — LIDOCAINE-EPINEPHRINE (PF) 1.5 %-1:200000 IJ SOLN
INTRAMUSCULAR | Status: DC | PRN
  Administered 2021-05-14: 3 mL via PERINEURAL

## 2021-05-14 MED ORDER — DIPHENHYDRAMINE HCL 25 MG PO CAPS: 25.0000 mg | ORAL_CAPSULE | Freq: Four times a day (QID) | ORAL | Status: DC | PRN

## 2021-05-14 MED ORDER — ONDANSETRON HCL 4 MG/2ML IJ SOLN: 4.0000 mg | Freq: Four times a day (QID) | INTRAMUSCULAR | Status: DC | PRN

## 2021-05-14 MED ORDER — MISOPROSTOL 25 MCG QUARTER TABLET
25.0000 ug | ORAL_TABLET | ORAL | Status: DC | PRN
  Filled 2021-05-14: qty 1

## 2021-05-14 MED ORDER — EPHEDRINE 5 MG/ML INJ: 10.0000 mg | INTRAVENOUS | Status: DC | PRN

## 2021-05-14 MED ORDER — FENTANYL CITRATE (PF) 100 MCG/2ML IJ SOLN: 50.0000 ug | INTRAMUSCULAR | Status: DC | PRN

## 2021-05-14 MED ORDER — SODIUM CHLORIDE 0.9% FLUSH: 3.0000 mL | INTRAVENOUS | Status: DC | PRN

## 2021-05-14 MED ORDER — DIPHENHYDRAMINE HCL 50 MG/ML IJ SOLN: 12.5000 mg | INTRAMUSCULAR | Status: DC | PRN

## 2021-05-14 MED ORDER — TERBUTALINE SULFATE 1 MG/ML IJ SOLN: 0.2500 mg | Freq: Once | INTRAMUSCULAR | Status: DC | PRN

## 2021-05-14 MED ORDER — COCONUT OIL OIL: 1.0000 "application " | TOPICAL_OIL | Status: DC | PRN

## 2021-05-14 MED ORDER — ONDANSETRON HCL 4 MG/2ML IJ SOLN: 4.0000 mg | INTRAMUSCULAR | Status: DC | PRN

## 2021-05-14 MED ORDER — BUPIVACAINE HCL (PF) 0.25 % IJ SOLN
INTRAMUSCULAR | Status: DC | PRN
  Administered 2021-05-14: 4 mL via EPIDURAL

## 2021-05-14 MED ORDER — AMMONIA AROMATIC IN INHA
RESPIRATORY_TRACT | Status: AC
  Filled 2021-05-14: qty 10

## 2021-05-14 MED ORDER — SODIUM CHLORIDE 0.9 % IV SOLN: 250.0000 mL | INTRAVENOUS | Status: DC | PRN

## 2021-05-14 MED ORDER — FENTANYL-BUPIVACAINE-NACL 0.5-0.125-0.9 MG/250ML-% EP SOLN
12.0000 mL/h | EPIDURAL | Status: DC | PRN
Start: 2021-05-14 — End: 2021-05-14
  Administered 2021-05-14: 12 mL/h via EPIDURAL

## 2021-05-14 MED ORDER — IBUPROFEN 600 MG PO TABS
600.0000 mg | ORAL_TABLET | Freq: Four times a day (QID) | ORAL | Status: DC
  Administered 2021-05-14 – 2021-05-15 (×2): 600 mg via ORAL
  Filled 2021-05-14 (×2): qty 1

## 2021-05-14 MED ORDER — SOD CITRATE-CITRIC ACID 500-334 MG/5ML PO SOLN: 30.0000 mL | ORAL | Status: DC | PRN

## 2021-05-14 MED ORDER — ONDANSETRON HCL 4 MG PO TABS: 4.0000 mg | ORAL_TABLET | ORAL | Status: DC | PRN

## 2021-05-14 MED ORDER — ACETAMINOPHEN 325 MG PO TABS: 650.0000 mg | ORAL_TABLET | ORAL | Status: DC | PRN

## 2021-05-14 MED ORDER — ACETAMINOPHEN 500 MG PO TABS: 1000.0000 mg | ORAL_TABLET | Freq: Four times a day (QID) | ORAL | Status: DC | PRN

## 2021-05-14 MED ORDER — SODIUM CHLORIDE 0.9% FLUSH: 3.0000 mL | Freq: Two times a day (BID) | INTRAVENOUS | Status: DC

## 2021-05-14 MED ORDER — OXYCODONE HCL 5 MG PO TABS: 10.0000 mg | ORAL_TABLET | ORAL | Status: DC | PRN

## 2021-05-14 MED ORDER — LIDOCAINE HCL (PF) 1 % IJ SOLN
INTRAMUSCULAR | Status: DC | PRN
  Administered 2021-05-14: 3 mL

## 2021-05-14 MED ORDER — OXYTOCIN-SODIUM CHLORIDE 30-0.9 UT/500ML-% IV SOLN: 1.0000 m[IU]/min | INTRAVENOUS | Status: DC

## 2021-05-14 MED ORDER — SIMETHICONE 80 MG PO CHEW: 80.0000 mg | CHEWABLE_TABLET | ORAL | Status: DC | PRN

## 2021-05-14 MED ORDER — LACTATED RINGERS IV SOLN
500.0000 mL | Freq: Once | INTRAVENOUS | Status: AC
  Administered 2021-05-14: 500 mL via INTRAVENOUS

## 2021-05-14 MED ORDER — SENNOSIDES-DOCUSATE SODIUM 8.6-50 MG PO TABS
2.0000 | ORAL_TABLET | Freq: Every day | ORAL | Status: DC
Start: 2021-05-15 — End: 2021-05-16
  Administered 2021-05-15 – 2021-05-16 (×2): 2 via ORAL
  Filled 2021-05-14 (×2): qty 2

## 2021-05-14 MED ORDER — MISOPROSTOL 25 MCG QUARTER TABLET: 25.0000 ug | ORAL_TABLET | ORAL | Status: DC | PRN

## 2021-05-14 MED ORDER — BENZOCAINE-MENTHOL 20-0.5 % EX AERO: 1.0000 "application " | INHALATION_SPRAY | CUTANEOUS | Status: DC | PRN

## 2021-05-14 MED ORDER — PRENATAL MULTIVITAMIN CH
1.0000 | ORAL_TABLET | Freq: Every day | ORAL | Status: DC
  Administered 2021-05-15: 1 via ORAL
  Filled 2021-05-14: qty 1

## 2021-05-14 MED ORDER — LACTATED RINGERS IV SOLN: INTRAVENOUS | Status: DC

## 2021-05-14 MED ORDER — FERROUS SULFATE 325 (65 FE) MG PO TABS
325.0000 mg | ORAL_TABLET | Freq: Two times a day (BID) | ORAL | Status: DC
  Administered 2021-05-15 – 2021-05-16 (×3): 325 mg via ORAL
  Filled 2021-05-14 (×3): qty 1

## 2021-05-14 MED ORDER — FENTANYL-BUPIVACAINE-NACL 0.5-0.125-0.9 MG/250ML-% EP SOLN
EPIDURAL | Status: AC
  Filled 2021-05-14: qty 250

## 2021-05-14 MED ORDER — MISOPROSTOL 200 MCG PO TABS
ORAL_TABLET | ORAL | Status: AC
  Filled 2021-05-14: qty 4

## 2021-05-14 MED ORDER — DIBUCAINE (PERIANAL) 1 % EX OINT: 1.0000 "application " | TOPICAL_OINTMENT | CUTANEOUS | Status: DC | PRN

## 2021-05-14 MED ORDER — LIDOCAINE HCL (PF) 1 % IJ SOLN
INTRAMUSCULAR | Status: AC
  Filled 2021-05-14: qty 30

## 2021-05-14 MED ORDER — OXYCODONE HCL 5 MG PO TABS: 5.0000 mg | ORAL_TABLET | ORAL | Status: DC | PRN

## 2021-05-14 MED ORDER — OXYTOCIN BOLUS FROM INFUSION
333.0000 mL | Freq: Once | INTRAVENOUS | Status: AC
  Administered 2021-05-14: 333 mL via INTRAVENOUS

## 2021-05-14 MED ORDER — OXYTOCIN-SODIUM CHLORIDE 30-0.9 UT/500ML-% IV SOLN
INTRAVENOUS | Status: AC
  Administered 2021-05-14: 2 m[IU]/min via INTRAVENOUS
  Filled 2021-05-14: qty 500

## 2021-05-14 MED ORDER — OXYTOCIN 10 UNIT/ML IJ SOLN
INTRAMUSCULAR | Status: AC
  Filled 2021-05-14: qty 2

## 2021-05-14 MED ORDER — LACTATED RINGERS IV SOLN: 500.0000 mL | INTRAVENOUS | Status: DC | PRN

## 2021-05-14 MED ORDER — TETANUS-DIPHTH-ACELL PERTUSSIS 5-2.5-18.5 LF-MCG/0.5 IM SUSY: 0.5000 mL | PREFILLED_SYRINGE | Freq: Once | INTRAMUSCULAR | Status: DC

## 2021-05-14 NOTE — Telephone Encounter (Signed)
Patient worried because she got sent home from hospital early this morning due to her being only 2cm dilated. She wanted to know if it's safe since she is 40+ weeks.

## 2021-05-14 NOTE — OB Triage Note (Signed)
Pt discharged home in stable condition. RN provided pt with discharge instructions, including labor precautions/when to come back. Pt verbalized understanding and all questions answered at this time.

## 2021-05-14 NOTE — Discharge Summary (Signed)
Obstetrical Discharge Summary  Patient Name: Savannah James DOB: September 28, 2000 MRN: [id number]  Date of Admission: 05/14/2021 Date of Delivery: 05/14/21 Delivered by: Anselm Pancoast Date of Discharge: 05/16/2021  Primary OB: [facility]  IOM:BTDHRCB'U last menstrual period was 08/01/2020. EDC Estimated Date of Delivery: 05/09/21 Gestational Age at Delivery: [redacted]w[redacted]d   Antepartum complications:  Obesity in pregnancy  Fetal renal pyelectasis - resolved on current [place] Admitting Diagnosis: Active labor Secondary Diagnosis: Patient Active Problem List   Diagnosis Date Noted   Fetal abnormality during pregnancy, antepartum left pyelectasis on 01/18/21 u/s 01/18/2021   Obesity affecting pregnancy BMI=36.3 10/31/2020   Breast mass, left 10/31/2020    Augmentation: AROM and Pitocin Complications: None  Intrapartum complications/course:  Delivery Type: spontaneous vaginal delivery Anesthesia: epidural Placenta: spontaneous Laceration: none Episiotomy: none Newborn Data: Live born female "Alazne" Birth Weight:  7lb11.5oz APGAR: 8, 9  Newborn Delivery   Birth date/time: 05/14/2021 21:15:00 Delivery type: Vaginal, Spontaneous      20 y.o. G3P2002 at [redacted]w[redacted]d presenting with contractions, AROM with clear fluid.  She progressed to complete and pushed over an intact perineum and delivered the fetal head, followed promptly by the shoulders. She was in control the whole time, and the baby placed on the maternal abdomen. Delayed cord clamping and the FOB cut the baby's cord, while the baby was skin to skin. The placenta delivered spontaneously and intact. No laceration. Mom and baby tolerated the procedure well.   Postpartum Procedures: None  Post partum course:  Patient had an uncomplicated postpartum course.  By time of discharge on PPD#2, her pain was controlled on oral pain medications; she had appropriate lochia and was ambulating, voiding without difficulty and tolerating regular diet.  She  was deemed stable for discharge to home.    Discharge Physical Exam:  BP 123/78 (BP Location: Left Arm)   Pulse 76   Temp 98.4 F (36.9 C) (Oral)   Resp 18   Ht 5\' 3"  (1.6 m)   Wt 97 kg   LMP 08/01/2020 Comment: normal period  SpO2 100%   BMI 37.88 kg/m   General: alert and no distress Pulm: normal respiratory effort Lochia: appropriate Abdomen: soft, NT Uterine Fundus: firm, below umbilicus Extremities: No evidence of DVT seen on physical exam. No lower extremity edema. Edinburgh11/26/2021 Postnatal Depression Scale Screening Tool 05/15/2021  I have been able to laugh and see the funny side of things. 0  I have looked forward with enjoyment to things. 0  I have blamed myself unnecessarily when things went wrong. 2  I have been anxious or worried for no good reason. 2  I have felt scared or panicky for no good reason. 1  Things have been getting on top of me. 1  I have been so unhappy that I have had difficulty sleeping. 0  I have felt sad or miserable. 0  I have been so unhappy that I have been crying. 0  The thought of harming myself has occurred to me. 0  Edinburgh Postnatal Depression Scale Total 6     Labs: CBC Latest Ref Rng & Units 05/15/2021 05/14/2021 10/31/2020  WBC 4.0 - 10.5 K/uL 11.1(H) 9.8 8.9  Hemoglobin 12.0 - 15.0 g/dL 11.1(L) 14.0 14.1  Hematocrit 36.0 - 46.0 % 30.0(L) 38.9 39.0  Platelets 150 - 400 K/uL 143(L) 159 236   O POS Performed at [facility], [address] [place], [place]  Hemoglobin  Date Value Ref Range Status  05/15/2021 11.1 (  L) 12.0 - 15.0 g/dL Final  16/06/9603 54.0 11.1 - 15.9 g/dL Final   HCT  Date Value Ref Range Status  05/15/2021 30.0 (L) 36.0 - 46.0 % Final   Hematocrit  Date Value Ref Range Status  10/31/2020 39.0 34.0 - 46.6 % Final    Disposition: stable, discharge to home Baby Feeding: breastmilk and formula Baby Disposition: home with mom  Contraception: IUD - Paragard  Prenatal Labs:   Blood type/Rh O pos  Antibody screen neg  Rubella Immune  Varicella Immune  RPR NR  HBsAg Neg  HIV NR  GC neg  Chlamydia neg  Genetic screening Quad 11/30/20: neg  1 hour GTT 133  3 hour GTT    GBS neg  Rh Immune globulin given: n/a Rubella vaccine given: Immune Varicella vaccine given: Immune Tdap vaccine given in AP or PP setting: 02/06/21 Flu vaccine given in AP or PP setting: n/a  Plan: Suheily Birks was discharged to home in good condition. Follow-up appointment with delivering provider in 6 weeks.  Discharge Instructions: Per After Visit Summary. Activity: Advance as tolerated. Pelvic rest for 6 weeks.   Diet: Regular Discharge Medications: Allergies as of 05/16/2021   No Known Allergies      Medication List     TAKE these medications    acetaminophen 325 MG tablet Commonly known as: Tylenol Take 2 tablets (650 mg total) by mouth every 4 (four) hours as needed (for pain scale < 4).   benzocaine-Menthol 20-0.5 % Aero Commonly known as: DERMOPLAST Apply 1 application topically as needed for irritation (perineal discomfort).   dibucaine 1 % Oint Commonly known as: NUPERCAINAL Place 1 application rectally as needed for hemorrhoids.   ferrous sulfate 325 (65 FE) MG tablet Take 1 tablet (325 mg total) by mouth 2 (two) times daily with a meal.   ibuprofen 600 MG tablet Commonly known as: ADVIL Take 1 tablet (600 mg total) by mouth every 6 (six) hours.   Prenatal Vitamins 28-0.8 MG Tabs Take 1 tablet by mouth daily.   senna-docusate 8.6-50 MG tablet Commonly known as: Senokot-S Take 2 tablets by mouth daily.   witch hazel-glycerin pad Commonly known as: TUCKS Apply 1 application topically as needed for hemorrhoids.       Outpatient follow up:   Follow-up Information     [facility]. Schedule an appointment as soon as possible for a visit in 6 week(s).   Contact information: [address] [place] [address]                Signed:  Donato Schultz, CNM Randa Ngo, CNM   05/16/2021 9:07 AM

## 2021-05-14 NOTE — Anesthesia Preprocedure Evaluation (Signed)
Anesthesia Evaluation  Patient identified by MRN, date of birth, ID band Patient awake    Reviewed: Allergy & Precautions, H&P , NPO status , Patient's Chart, lab work & pertinent test results, reviewed documented beta blocker date and time   Airway Mallampati: II  TM Distance: >3 FB Neck ROM: full    Dental no notable dental hx. (+) Teeth Intact   Pulmonary neg pulmonary ROS, Current Smoker,    Pulmonary exam normal breath sounds clear to auscultation       Cardiovascular Exercise Tolerance: Good negative cardio ROS   Rhythm:regular Rate:Normal     Neuro/Psych negative neurological ROS  negative psych ROS   GI/Hepatic negative GI ROS, Neg liver ROS,   Endo/Other  negative endocrine ROSdiabetes, Gestational  Renal/GU      Musculoskeletal   Abdominal   Peds  Hematology negative hematology ROS (+)   Anesthesia Other Findings   Reproductive/Obstetrics (+) Pregnancy                             Anesthesia Physical Anesthesia Plan  ASA: 2  Anesthesia Plan: Epidural   Post-op Pain Management:    Induction:   PONV Risk Score and Plan:   Airway Management Planned:   Additional Equipment:   Intra-op Plan:   Post-operative Plan:   Informed Consent: I have reviewed the patients History and Physical, chart, labs and discussed the procedure including the risks, benefits and alternatives for the proposed anesthesia with the patient or authorized representative who has indicated his/her understanding and acceptance.       Plan Discussed with:   Anesthesia Plan Comments:         Anesthesia Quick Evaluation  

## 2021-05-14 NOTE — Telephone Encounter (Signed)
Return call to client who states discharged from [facility] L & D at 2 am today. States was told dilated 2 cm and 50% thinned out. States in past few hours contractions have become stronger and more painful. Rates pain during contraction as an 8 and states has some trouble breathing ("catching breath") with contraction.s States thinks contractions are every 8 - 10 minutes and thinks they last 40 seconds.. Denies leakage of fluid / vaginal bleeding. States now when has contraction baby is getting "pushed up" and this is different that when went to hospital. Also reports baby "moving slower'. Client voicing concerns for potential problem since she is over 40 weeks (aware of IOL appt 05/16/21). Encouraged L & D evaluation as reporting decreased fetal movement. Aware may be discharged to home if not in active labor and contractions are not q 5 monutes apart and lasting for 60 seconds as earlier this am. Jossie Ng, RN

## 2021-05-14 NOTE — Anesthesia Procedure Notes (Signed)
Epidural Patient location during procedure: OB Start time: 05/14/2021 3:20 PM End time: 05/14/2021 3:30 PM  Staffing Anesthesiologist: Yevette Edwards, MD Resident/CRNA: Elmarie Mainland, CRNA Performed: resident/CRNA   Preanesthetic Checklist Completed: patient identified, IV checked, site marked, risks and benefits discussed, surgical consent, monitors and equipment checked, pre-op evaluation and timeout performed  Epidural Patient position: sitting Prep: ChloraPrep Patient monitoring: heart rate, continuous pulse ox and blood pressure Approach: midline Location: L3-L4 Injection technique: LOR saline  Needle:  Needle type: Tuohy  Needle gauge: 17 G Needle length: 9 cm and 9 Needle insertion depth: 7.5 cm Catheter type: closed end flexible Catheter size: 19 Gauge Catheter at skin depth: 12 cm Test dose: negative and 1.5% lidocaine with Epi 1:200 K  Assessment Sensory level: T10 Events: blood not aspirated, injection not painful, no injection resistance, no paresthesia and negative IV test  Additional Notes 1 attempt Pt. Evaluated and documentation done after procedure finished. Patient identified. Risks/Benefits/Options discussed with patient including but not limited to bleeding, infection, nerve damage, paralysis, failed block, incomplete pain control, headache, blood pressure changes, nausea, vomiting, reactions to medication both or allergic, itching and postpartum back pain. Confirmed with bedside nurse the patient's most recent platelet count. Confirmed with patient that they are not currently taking any anticoagulation, have any bleeding history or any family history of bleeding disorders. Patient expressed understanding and wished to proceed. All questions were answered. Sterile technique was used throughout the entire procedure. Please see nursing notes for vital signs. Test dose was given through epidural catheter and negative prior to continuing to dose epidural or start  infusion. Warning signs of high block given to the patient including shortness of breath, tingling/numbness in hands, complete motor block, or any concerning symptoms with instructions to call for help. Patient was given instructions on fall risk and not to get out of bed. All questions and concerns addressed with instructions to call with any issues or inadequate analgesia.    Patient tolerated the insertion well without immediate complications.Reason for block:procedure for pain

## 2021-05-14 NOTE — OB Triage Note (Signed)
Patient arrived with complaints of contractions 8/10 on pain scale that started last night and are now every 7 min apart. Pt states she was seen here last night but that the contractions are stronger now. Pt denies LOF, Vag bleeding, or any other concerns. Pt stats baby is moving well. Monitors applied and assessing.

## 2021-05-14 NOTE — Discharge Instructions (Signed)

## 2021-05-14 NOTE — H&P (Signed)
OB History & Physical   History of Present Illness:  Chief Complaint:   HPI:  Savannah James is a 20 y.o. G39P2002 female at [redacted]w[redacted]d dated by u/s.  She presents to L&D for uterine contractions   She reports:  -active fetal movement -no leakage of fluid -no vaginal bleeding -onset of contractions last night, became stronger this am  Pregnancy Issues: 1. Obesity in pregnancy  Fetal renal pyelectasis - resolved on current [place]    Maternal Medical History:   Past Medical History:  Diagnosis Date   Medical history non-contributory    Patient denies medical problems    Vaginal Pap smear, abnormal    per patient, never got results    Past Surgical History:  Procedure Laterality Date   denies      No Known Allergies  Prior to Admission medications   Medication Sig Start Date End Date Taking? Authorizing Provider  Prenatal Vit-Fe Fumarate-FA (PRENATAL VITAMINS) 28-0.8 MG TABS Take 1 tablet by mouth daily. 03/20/21 06/28/21 Yes Federico Flake, MD     Prenatal care site: [facility]   Social History: She  reports that she has never smoked. She has never used smokeless tobacco. She reports that she does not currently use alcohol after a past usage of about 6.0 standard drinks per week. She reports that she does not use drugs.  Family History: family history includes Cancer in her brother; Hepatitis in her brother; Hypertension in her mother; Liver cancer in her brother.   Review of Systems: A full review of systems was performed and negative except as noted in the HPI.    Physical Exam:  Vital Signs: BP 105/67 (BP Location: Right Arm)   Pulse 68   Temp 98.3 F (36.8 C) (Oral)   Resp 18   Ht 5\' 3"  (1.6 m)   Wt 97 kg   LMP 08/01/2020 Comment: normal period  SpO2 100%   BMI 37.88 kg/m   General:   alert and cooperative  Skin:  normal  Neurologic:    Alert & oriented x 3  Lungs:    Nl effort  Heart:   regular rate and rhythm  Abdomen:  soft,  non-tender; bowel sounds normal; no masses,  no organomegaly  Extremities: : non-tender, symmetric, no edema bilaterally.     Results for orders placed or performed during the hospital encounter of 05/14/21 (from the past 24 hour(s))  CBC     Status: None   Collection Time: 05/14/21  1:11 PM  Result Value Ref Range   WBC 9.8 4.0 - 10.5 K/uL   RBC 4.50 3.87 - 5.11 MIL/uL   Hemoglobin 14.0 12.0 - 15.0 g/dL   HCT 07/14/21 50.2 - 77.4 %   MCV 86.4 80.0 - 100.0 fL   MCH 31.1 26.0 - 34.0 pg   MCHC 36.0 30.0 - 36.0 g/dL   RDW 12.8 78.6 - 76.7 %   Platelets 159 150 - 400 K/uL   nRBC 0.0 0.0 - 0.2 %  Type and screen     Status: None   Collection Time: 05/14/21  1:11 PM  Result Value Ref Range   ABO/RH(D) O POS    Antibody Screen NEG    Sample Expiration      05/17/2021,2359 Performed at [facility], [address] [place], [place]   Resp Panel by RT-PCR (Flu A&B, Covid) Nasopharyngeal Swab     Status: None   Collection Time: 05/14/21  1:11 PM  Specimen: Nasopharyngeal Swab; Nasopharyngeal(NP) swabs in vial transport medium  Result Value Ref Range   SARS Coronavirus 2 by RT PCR NEGATIVE NEGATIVE   Influenza A by PCR NEGATIVE NEGATIVE   Influenza B by PCR NEGATIVE NEGATIVE  ABO/Rh     Status: None   Collection Time: 05/14/21  2:01 PM  Result Value Ref Range   ABO/RH(D)      O POS Performed at [facility], [address] [place], [place] [address]     Pertinent Results:  Prenatal Labs: Blood type/Rh O pos  Antibody screen neg  Rubella Immune  Varicella Immune  RPR NR  HBsAg Neg  HIV NR  GC neg  Chlamydia neg  Genetic screening Quad 11/30/20: neg  1 hour GTT 133  3 hour GTT   GBS neg   FHT: FHR: 145 bpm, variability: moderate,  accelerations:  Present,  decelerations:  Absent Category/reactivity:  Category I TOCO: irregular SVE: Dilation: 4.5 / Effacement (%): 50 / Station: -3, Ballotable      Assessment:  Savannah James is a 20  y.o. G77P2002 female at [redacted]w[redacted]d with uterine contractions.   Plan:  1. Admit to Labor & Delivery; consents reviewed and obtained  2. Fetal Well being  - Fetal Tracing: Cat I - GBS neg - Presentation: VTX confirmed by SVE   3. Routine OB: - Prenatal labs reviewed, as above - Rh pod - CBC & T&S on admit - Clear fluids, IVF  4. Monitoring of Labor -  Contractions by external toco in place -  Pelvis proven to 2892g -  Plan for continuous fetal monitoring  -  Maternal pain control as desired: IVPM, nitrous, regional anesthesia - Anticipate vaginal delivery  5. Post Partum Planning: - Infant feeding: Both - Contraception: Paragrad - Tdap 02/06/21  Haroldine Laws, CNM 05/14/2021 7:15 PM

## 2021-05-14 NOTE — Progress Notes (Signed)
Labor Progress Note  Savannah James is a 20 y.o. G3P2002 at 107w5d by ultrasound admitted for active labor  Subjective: Pt is comfortable  Objective: BP 121/80   Pulse 78   Temp 98.3 F (36.8 C) (Oral)   Resp 16   Ht 5\' 3"  (1.6 m)   Wt 97 kg   LMP 08/01/2020 Comment: normal period  SpO2 99%   BMI 37.88 kg/m   Fetal Assessment: FHT:  FHR: 120 bpm, variability: moderate,  accelerations:  Present,  decelerations:  Absent Category/reactivity:  Category I UC:   regular, every 2-5 minutes SVE:    Dilation: 5 cm  Effacement: 50%  Station:  -3  Consistency: soft  Position: posterior  Membrane status:Intact Amniotic color: n/a  Labs: Lab Results  Component Value Date   WBC 9.8 05/14/2021   HGB 14.0 05/14/2021   HCT 38.9 05/14/2021   MCV 86.4 05/14/2021   PLT 159 05/14/2021    Assessment / Plan: Augmentation of labor, progressing well, Pitocin started now  Labor: Progressing on Pitocin, will continue to increase then AROM Preeclampsia:   123/76 Fetal Wellbeing:  Category I Pain Control:  Labor support without medications I/D:   Afebrile, GBS neg, Intact Anticipated MOD:  NSVD  07/14/2021, CNM 05/14/2021, 7:32 PM

## 2021-05-14 NOTE — Discharge Summary (Signed)
Savannah James is a 20 y.o. female. She is at [redacted]w[redacted]d gestation. Patient's last menstrual period was 08/01/2020. Estimated Date of Delivery: 05/09/21  Prenatal care site: [facility]  Chief complaint: uterine contractions and LOF  HPI: Savannah James presents to L&D with complaints of mild contractions and possible loss of fluid.  She reports feeling a small gush of fluid but hasn't had any more continued leaking.  Denies vaginal bleeding. Endorses good fetal movement.  Has an IOL for postdates pregnancy scheduled for 05/16/2021   Factors complicating pregnancy: Obesity in pregnancy  Fetal renal pyelectasis - resolved on current [place]   S: Resting comfortably. Irregular, mild contractions,no VB.no LOF,  Active fetal movement.   Maternal Medical History:  Past Medical Hx:  has a past medical history of Medical history non-contributory, Patient denies medical problems, and Vaginal Pap smear, abnormal.    Past Surgical Hx:  has a past surgical history that includes denies.   No Known Allergies   Prior to Admission medications   Medication Sig Start Date End Date Taking? Authorizing Provider  Prenatal Vit-Fe Fumarate-FA (PRENATAL VITAMINS) 28-0.8 MG TABS Take 1 tablet by mouth daily. 03/20/21 06/28/21 Yes Federico Flake, MD    Social History: She  reports that she has never smoked. She has never used smokeless tobacco. She reports that she does not currently use alcohol after a past usage of about 6.0 standard drinks per week. She reports that she does not use drugs.  Family History: family history includes Cancer in her brother; Hepatitis in her brother; Hypertension in her mother; Liver cancer in her brother.   Review of Systems: A full review of systems was performed and negative except as noted in the HPI.    O:  BP 121/77   Pulse (!) 57   Temp 98.6 F (37 C) (Oral)   Resp 16   Ht 5\' 3"  (1.6 m)   Wt 97.1 kg   LMP 08/01/2020 Comment: normal period  BMI 37.91 kg/m  Results for orders  placed or performed during the hospital encounter of 05/13/21 (from the past 48 hour(s))  ROM Plus ([facility] only)   Collection Time: 05/13/21 11:23 PM  Result Value Ref Range   Rom Plus NEGATIVE       Constitutional: NAD, AAOx3  HE/ENT: extraocular movements grossly intact, moist mucous membranes CV: RRR PULM: nl respiratory effort, CTABL Abd: gravid, non-tender, non-distended, soft  Ext: Non-tender, Nonedmeatous Psych: mood appropriate, speech normal Pelvic : moderate amount of physiologic discharge SVE: Dilation: 1.5 Effacement (%): 50 Station: -3 Exam by:: [email or web address] RN   Fetal Monitor: Baseline: 130 bpm Variability: moderate Accels: Present Decels: none Toco: Irregular, mild contractions   Category: I   Assessment: 20 y.o. 102w5d here for antenatal surveillance during pregnancy.  Principle diagnosis: Uterine contractions in pregnancy    Plan: Labor: not present.  Fetal Wellbeing: Reassuring Cat 1 tracing. Reactive NST  ROM + negative and no continued LOF D/c home stable, precautions reviewed, follow-up as scheduled.  IOL scheduled for 05/16/2021  ----- 07/16/2021, CNM Certified Nurse Midwife [facility]

## 2021-05-14 NOTE — Progress Notes (Signed)
Labor Progress Note  Savannah James is a 20 y.o. G3P2002 at [redacted]w[redacted]d by ultrasound admitted for active labor  Subjective: Pt is comfortable with epidural  Objective: BP 121/80   Pulse 78   Temp 98.3 F (36.8 C) (Oral)   Resp 16   Ht 5\' 3"  (1.6 m)   Wt 97 kg   LMP 08/01/2020 Comment: normal period  SpO2 99%   BMI 37.88 kg/m   Fetal Assessment: FHT:  FHR: 130 bpm, variability: moderate,  accelerations:  Present,  decelerations:  Present earlies Category/reactivity:  Category I UC:   regular, every 1-90minutes SVE:    Dilation: 7 cm  Effacement: 100%  Station:  -1  Consistency: soft  Position: anterior  Membrane status:AROM'd 1900 Amniotic color: clear  Labs: Lab Results  Component Value Date   WBC 9.8 05/14/2021   HGB 14.0 05/14/2021   HCT 38.9 05/14/2021   MCV 86.4 05/14/2021   PLT 159 05/14/2021    Assessment / Plan: Augmentation of labor, progressing well, Pitocin at 41mU  Labor: Progressing normally Preeclampsia:   105/67 Fetal Wellbeing:  Category I Pain Control:  Epidural I/D:   Afebrile, GBS neg, AROM x0hrs Anticipated MOD:  NSVD  11m, CNM 05/14/2021, 7:36 PM

## 2021-05-15 LAB — CBC
HCT: 30 % — ABNORMAL LOW (ref 36.0–46.0)
Hemoglobin: 11.1 g/dL — ABNORMAL LOW (ref 12.0–15.0)
MCH: 32.6 pg (ref 26.0–34.0)
MCHC: 37 g/dL — ABNORMAL HIGH (ref 30.0–36.0)
MCV: 88 fL (ref 80.0–100.0)
Platelets: 143 10*3/uL — ABNORMAL LOW (ref 150–400)
RBC: 3.41 MIL/uL — ABNORMAL LOW (ref 3.87–5.11)
RDW: 13.7 % (ref 11.5–15.5)
WBC: 11.1 10*3/uL — ABNORMAL HIGH (ref 4.0–10.5)
nRBC: 0 % (ref 0.0–0.2)

## 2021-05-15 LAB — RPR: RPR Ser Ql: NONREACTIVE

## 2021-05-15 MED ORDER — IBUPROFEN 600 MG PO TABS
600.0000 mg | ORAL_TABLET | Freq: Four times a day (QID) | ORAL | Status: DC
  Administered 2021-05-15 – 2021-05-16 (×4): 600 mg via ORAL
  Filled 2021-05-15 (×4): qty 1

## 2021-05-15 NOTE — Anesthesia Postprocedure Evaluation (Signed)
Anesthesia Post Note  Patient: Savannah James  Procedure(s) Performed: AN AD HOC LABOR EPIDURAL  Patient location during evaluation: Mother Baby Anesthesia Type: Epidural Level of consciousness: awake and alert Pain management: pain level controlled Vital Signs Assessment: post-procedure vital signs reviewed and stable Respiratory status: spontaneous breathing, nonlabored ventilation and respiratory function stable Cardiovascular status: stable Postop Assessment: no headache, no backache and epidural receding Anesthetic complications: no   No notable events documented.   Last Vitals:  Vitals:   05/15/21 0439 05/15/21 0746  BP: 117/84 112/70  Pulse: 80 82  Resp: 18 18  Temp: 36.7 C 36.6 C  SpO2: 100% 100%    Last Pain:  Vitals:   05/15/21 0746  TempSrc: Oral  PainSc:                  Karoline Caldwell

## 2021-05-15 NOTE — Lactation Note (Signed)
This note was copied from a baby's chart. Lactation Consultation Note  Patient Name: Savannah James [id number] Date: 05/15/2021 Reason for consult: Initial assessment;Term Age:22 hours  Initial lactation visit. Mom is P3, SVD 12 hours ago. Mom has breastfeeding and formula feeding experience with other 2 children and plans same feeding plan with this baby. Mom BF her first for total of 73yrs, and 2nd for total of 48yr.  Baby has had documented feeds at the breast as well as bottle feeds, 2 stool diapers and noted emesis.   LC praised mom for time breastfeeding with other children and desire to provide the same nutrition to this baby. Encouraged baby at the breast as much as possible to aid in adequate milk production.  Mom voices no concerns at this time. Whiteboard updated with LC name/number, encouraged to call with next feeding attempt.  Maternal Data Has patient been taught Hand Expression?: Yes Does the patient have breastfeeding experience prior to this delivery?: Yes How long did the patient breastfeed?: 6yr, 29yr  Feeding Mother's Current Feeding Choice: Breast Milk and Formula  LATCH Score                    Lactation Tools Discussed/Used    Interventions Interventions: Breast feeding basics reviewed;Education  Discharge    Consult Status Consult Status: Follow-up Date: 05/15/21 Follow-up type: Call as needed    Danford Bad 05/15/2021, 10:18 AM

## 2021-05-15 NOTE — Progress Notes (Signed)
Post Partum Day 1  Subjective: no complaints, up ad lib, voiding, tolerating PO, and + flatus  Doing well, no concerns. Ambulating without difficulty, pain managed with PO meds, tolerating regular diet, and voiding without difficulty.   No fever/chills, chest pain, shortness of breath, nausea/vomiting, or leg pain. No nipple or breast pain. No headache, visual changes, or RUQ/epigastric pain.  Objective: BP 112/70 (BP Location: Right Arm)   Pulse 82   Temp 97.8 F (36.6 C) (Oral)   Resp 18   Ht 5\' 3"  (1.6 m)   Wt 97 kg   LMP 08/01/2020 Comment: normal period  SpO2 100%   BMI 37.88 kg/m    Physical Exam:  General: alert, cooperative, and appears stated age Breasts: soft/nontender nipple  CV: RRR Pulm: nl effort, CTABL Abdomen: soft, non-tender, active bowel sounds Uterine Fundus: firm Perineum: minimal edema, intact/lacerations hemostatic/repair well approximated Lochia: appropriate DVT Evaluation: No evidence of DVT seen on physical exam. Negative Homan's sign. Edinburgh: No flowsheet data found.   Recent Labs    05/14/21 1311 05/15/21 0504  HGB 14.0 11.1*  HCT 38.9 30.0*  WBC 9.8 11.1*  PLT 159 143*    Assessment/Plan: 20 y.o. G3P2002 postpartum day # 1  -Continue routine postpartum care -Lactation consult PRN for breastfeeding -Discussed contraceptive options including implant, IUDs hormonal and non-hormonal, injection, pills/ring/patch, condoms, and NFP. -patient desires Mirena IUD  -Acute blood loss anemia - hemodynamically stable and asymptomatic; start PO ferrous sulfate BID with stool softeners  -Immunization status:  all immunizations up to date  Disposition: Continue inpatient postpartum care    LOS: 1 day   07/15/21, CNM 05/15/2021, 10:53 AM

## 2021-05-16 MED ORDER — IBUPROFEN 600 MG PO TABS: 600.0000 mg | ORAL_TABLET | Freq: Four times a day (QID) | ORAL | 0 refills | Status: AC

## 2021-05-16 MED ORDER — WITCH HAZEL-GLYCERIN EX PADS: 1.0000 "application " | MEDICATED_PAD | CUTANEOUS | 12 refills | Status: DC | PRN

## 2021-05-16 MED ORDER — FERROUS SULFATE 325 (65 FE) MG PO TABS: 325.0000 mg | ORAL_TABLET | Freq: Two times a day (BID) | ORAL | 3 refills | Status: DC

## 2021-05-16 MED ORDER — ACETAMINOPHEN 325 MG PO TABS: 650.0000 mg | ORAL_TABLET | ORAL | Status: DC | PRN

## 2021-05-16 MED ORDER — SENNOSIDES-DOCUSATE SODIUM 8.6-50 MG PO TABS: 2.0000 | ORAL_TABLET | Freq: Every day | ORAL | 0 refills | Status: DC

## 2021-05-16 MED ORDER — BENZOCAINE-MENTHOL 20-0.5 % EX AERO: 1.0000 "application " | INHALATION_SPRAY | CUTANEOUS | Status: DC | PRN

## 2021-05-16 MED ORDER — DIBUCAINE (PERIANAL) 1 % EX OINT: 1.0000 "application " | TOPICAL_OINTMENT | CUTANEOUS | Status: DC | PRN

## 2021-05-16 NOTE — Discharge Instructions (Signed)

## 2021-05-16 NOTE — Lactation Note (Signed)
This note was copied from a baby's chart. Lactation Consultation Note  Patient Name: Savannah James [id number] Date: 05/16/2021 Reason for consult: Follow-up assessment;Term Age:22 hours  Lactation follow-up prior to anticipated discharge. Mom reports continuing to alternate between breast and bottle feedings, as well as providing formula if she feels baby is still hungry post feeds at the breast. All 24/36hr testing completed, baby has voided/stooled above minimum expectations.  Mom encouraged to offer the breast with every feeding, keep baby awake at breast during feeds to ensure adequate emptying.  Information given for outpatient lactation services. Encouraged to call with questions/concerns and for ongoing BF support as needed.  Maternal Data Has patient been taught Hand Expression?: Yes Does the patient have breastfeeding experience prior to this delivery?: Yes How long did the patient breastfeed?: 93yr, 8yr  Feeding Mother's Current Feeding Choice: Breast Milk and Formula Nipple Type: Slow - flow  LATCH Score                    Lactation Tools Discussed/Used    Interventions Interventions: Education  Discharge Discharge Education: Engorgement and breast care;Warning signs for feeding baby;Outpatient recommendation WIC Program: Yes  Consult Status Consult Status: Complete Date: 05/16/21 Follow-up type: Call as needed    Danford Bad 05/16/2021, 10:46 AM

## 2021-05-16 NOTE — Progress Notes (Signed)
Patient discharged home with family.  Discharge instructions, when to follow up, and prescriptions reviewed with patient.  Patient verbalized understanding. Patient will be escorted out by auxiliary.   

## 2021-06-28 ENCOUNTER — Ambulatory Visit: Payer: Self-pay

## 2021-07-01 ENCOUNTER — Encounter: Payer: Self-pay | Admitting: Advanced Practice Midwife

## 2021-07-01 ENCOUNTER — Ambulatory Visit: Payer: Self-pay | Admitting: Advanced Practice Midwife

## 2021-07-01 ENCOUNTER — Other Ambulatory Visit: Payer: Self-pay

## 2021-07-01 DIAGNOSIS — Z30013 Encounter for initial prescription of injectable contraceptive: Secondary | ICD-10-CM

## 2021-07-01 LAB — HM HIV SCREENING LAB: HM HIV Screening: NEGATIVE

## 2021-07-01 LAB — HEMOGLOBIN, FINGERSTICK: Hemoglobin: 12.7 g/dL (ref 11.1–15.9)

## 2021-07-01 LAB — PREGNANCY, URINE: Preg Test, Ur: NEGATIVE

## 2021-07-01 MED ORDER — MEDROXYPROGESTERONE ACETATE 150 MG/ML IM SUSP
150.0000 mg | INTRAMUSCULAR | Status: AC
  Administered 2021-07-01 – 2022-06-02 (×4): 150 mg via INTRAMUSCULAR

## 2021-07-01 NOTE — Progress Notes (Signed)
UPT negative and Depo administered per Hazle Coca CNM order written today. Client tolerated injection without complaint. Client given written reminder of when home UPT due (07/14/2021). Counseled to use condoms with all sex during next 7 days and condoms provided. Hgb = 12.7 and no iron needed per standing order. Jossie Ng, RN

## 2021-07-01 NOTE — Progress Notes (Signed)
Post Partum Exam  Savannah James is a 20 y.o. SHF nonsmoker G38P3003 female who presents for a postpartum visit. She is 7 weeks postpartum following a spontaneous vaginal delivery on 05/14/21 after pit and AROM augmentation 7#11 F. Breast and formula feeding. Baby weighed 13 lbs on 06/27/21. LMP 08/01/20. PP coitus x1 yesterday with condom; with current partner x 1 1/2 years. Nursing q hour and formula 3x/day (3oz). I have fully reviewed the prenatal and intrapartum course. The delivery was at 40 5/7 gestational weeks.  Anesthesia: epidural. Postpartum course has been wnl. Baby's course has been wnl. Baby is feeding by  breast and formula.   Bleeding no bleeding. Bowel function is normal. Bladder function is normal. Patient is sexually active. Contraception method is condoms.   Postpartum depression screening:  Edinburgh Postnatal Depression Scale - 07/01/21 1046       Edinburgh Postnatal Depression Scale:  In the Past 7 Days   I have been able to laugh and see the funny side of things. 0    I have looked forward with enjoyment to things. 0    I have blamed myself unnecessarily when things went wrong. 2    I have been anxious or worried for no good reason. 2    I have felt scared or panicky for no good reason. 0    Things have been getting on top of me. 2    I have been so unhappy that I have had difficulty sleeping. 1    I have felt sad or miserable. 0    I have been so unhappy that I have been crying. 0    The thought of harming myself has occurred to me. 0    Edinburgh Postnatal Depression Scale Total 7              The following portions of the patient's history were reviewed and updated as appropriate: allergies, current medications, past family history, past medical history, past social history, past surgical history, and problem list. Last pap smear done 06/21/20 and was Normal  Review of Systems Pertinent items are noted in HPI.    Objective:  BP 115/75   Pulse 70    Ht 5\' 2"  (1.575 m)   Wt 190 lb (86.2 kg)   Breastfeeding Yes Comment: Breast and formula feeding infant.  BMI 34.75 kg/m   Gen: well appearing, NAD HEENT: no scleral icterus CV: RR Lung: Normal WOB Breast:performed- lactating   Ext: warm well perfused  GU: external genitalia wnl, vagina well rugaeted, clear leukorrhea Rectal: performed -  not indicated       Assessment:    7 wk postpartum exam. Pap smear not done at today's visit.   Plan:   Essential components of care per ACOG recommendations for Comprehensive Postpartum exam:  1.  Mood and well being: Patient with negative depression screening today. Reviewed local resources for support. EPDS is low risk. Reviewed resources and that mood sx in first year after pregnancy are considered related to pregnancy and to reach out for help at [facility] if needed. Discussed [facility] as link to care and availability of LCSW for counseling  - Patient does not use tobacco. - hx of drug use? No  2. Infant care and feeding:  -Patient currently breastmilk feeding? Yes If breastmilk feeding discussed return to work and pumping. If needed, patient was provided letter for work to allow for every 2-3 hr pumping breaks, and to be granted a private location to express breastmilk  and refrigerated area to store breastmilk. Reviewed importance of draining breast regularly to support lactation. I  -Recommended patient engage with WIC/BFpeer counselors  -Counseled to sign new child up for [facility] services -Social determinants of health (SDOH) reviewed in EPIC. No concerns. The following needs were identified none  3. Sexuality, contraception and birth spacing  Contraception: Contraception counseling: Reviewed all forms of birth control options in the tiered based approach. available including abstinence; over the counter/barrier methods; hormonal contraceptive medication including pill, patch, ring, injection,contraceptive implant; hormonal and nonhormonal IUDs;  permanent sterilization options including vasectomy and the various tubal sterilization modalities. Risks, benefits, and typical effectiveness rates were reviewed.  Questions were answered.  Written information was also given to the patient to review.  Patient desires DMPA, this was prescribed for patient. She will follow up in  11-13 wks for surveillance.  She was told to call with any further questions, or with any concerns about this method of contraception.  Emphasized use of condoms 100% of the time for STI prevention.  Patient was offered ECP. ECP was not accepted by the patient. ECP counseling was not given - see RN documentation  - Patient does not want a pregnancy in the next year.  Desired family size is 3 children.  - Reviewed forms of contraception in tiered fashion. Patient desired Depo-Provera today.   - Discussed birth spacing of 18 months  4. Sleep and fatigue -Encouraged family/partner/community support of 4 hrs of uninterrupted sleep to help with mood and fatigue  5. Physical Recovery  - Discussed patients delivery and complications - Patient had a no  laceration, perineal healing reviewed. Patient expressed understanding - Patient has urinary incontinence? No  - Patient is safe to resume physical and sexual activity  6.  Health Maintenance/Chronic Disease Health Maintenance Due  Topic Date Due   COVID-19 Vaccine (1) Never done   HPV VACCINES (1 - 2-dose series) Never done   HIV Screening  Never done   INFLUENZA VACCINE  Never done    - Last pap smear performed10/14/21 and was normal No Mammogram  1. Postpartum exam No c/o and recovered well - Hemoglobin, venipuncture - Pregnancy, urine  2. Encounter for initial prescription of injectable contraceptive If PT neg today may have DMPA 150 mg IM q 11-13 wks x 1 year Please counsel pt to do home PT 07/14/21 and call if + Pt decliines Plan B today Please counsel on need for abstinance/back up condoms next 7 days -  medroxyPROGESTERone (DEPO-PROVERA) injection 150 mg   Patient given handout about PCP care in the community Given MVI per family planning program guidelines and availability  Follow up in:  11  weeks or as needed.

## 2021-09-19 ENCOUNTER — Other Ambulatory Visit: Payer: Self-pay

## 2021-09-19 ENCOUNTER — Ambulatory Visit (LOCAL_COMMUNITY_HEALTH_CENTER): Payer: Self-pay

## 2021-09-19 VITALS — BP 131/81 | Ht 62.0 in | Wt 188.0 lb

## 2021-09-19 DIAGNOSIS — Z3042 Encounter for surveillance of injectable contraceptive: Secondary | ICD-10-CM

## 2021-09-19 DIAGNOSIS — Z3009 Encounter for other general counseling and advice on contraception: Secondary | ICD-10-CM

## 2021-09-19 NOTE — Progress Notes (Signed)
DMPA 150 mg given IM (Right Deltoid) today per 07/01/2021 order by Arnetha Courser, CNM order and tolerated well.  Patient is 11w 3 d post last Depo.  Patient reports doing the home UPT in November as instructed on 07-14-2021 and it was Negative.  Hart Carwin, RN

## 2021-12-09 ENCOUNTER — Ambulatory Visit (LOCAL_COMMUNITY_HEALTH_CENTER): Payer: Self-pay

## 2021-12-09 VITALS — BP 123/81 | Ht 62.0 in | Wt 191.0 lb

## 2021-12-09 DIAGNOSIS — Z30013 Encounter for initial prescription of injectable contraceptive: Secondary | ICD-10-CM

## 2021-12-09 DIAGNOSIS — Z3042 Encounter for surveillance of injectable contraceptive: Secondary | ICD-10-CM

## 2021-12-09 DIAGNOSIS — Z3009 Encounter for other general counseling and advice on contraception: Secondary | ICD-10-CM

## 2021-12-09 NOTE — Progress Notes (Signed)
11 weeks 4 days post depo.  Desires to continue. Depo given right deltoid per order by E. Sciora CNM dated 07/01/21.  Pt. Tolerated well.  ? ?Concerned about wt gain.  Counseled walk daily, increase H20 intake, and eat healthy foods.  ? ?Next depo due 02/24/22; reminder given. ? ?[name], RN ? ? ?

## 2022-02-24 ENCOUNTER — Ambulatory Visit: Payer: Self-pay

## 2022-03-10 ENCOUNTER — Ambulatory Visit (LOCAL_COMMUNITY_HEALTH_CENTER): Payer: Self-pay

## 2022-03-10 ENCOUNTER — Ambulatory Visit: Payer: Self-pay

## 2022-03-10 VITALS — BP 113/81 | Ht 62.0 in | Wt 190.0 lb

## 2022-03-10 DIAGNOSIS — Z30013 Encounter for initial prescription of injectable contraceptive: Secondary | ICD-10-CM

## 2022-03-10 DIAGNOSIS — Z3009 Encounter for other general counseling and advice on contraception: Secondary | ICD-10-CM

## 2022-03-10 DIAGNOSIS — Z3042 Encounter for surveillance of injectable contraceptive: Secondary | ICD-10-CM

## 2022-03-10 NOTE — Progress Notes (Signed)
13 weeks 0 days post depo. Voices no concerns today.  Depo given today per order by Hazle Coca, CNM dated 07/01/2021. Tolerated well L delt. Next depo due 05/26/2022, has reminder. Jerel Shepherd, RN

## 2022-06-02 ENCOUNTER — Ambulatory Visit (LOCAL_COMMUNITY_HEALTH_CENTER): Payer: Self-pay

## 2022-06-02 ENCOUNTER — Ambulatory Visit: Payer: Self-pay

## 2022-06-02 VITALS — BP 120/77 | Ht 62.0 in | Wt 190.5 lb

## 2022-06-02 DIAGNOSIS — Z3042 Encounter for surveillance of injectable contraceptive: Secondary | ICD-10-CM

## 2022-06-02 DIAGNOSIS — Z3009 Encounter for other general counseling and advice on contraception: Secondary | ICD-10-CM

## 2022-06-02 DIAGNOSIS — Z30013 Encounter for initial prescription of injectable contraceptive: Secondary | ICD-10-CM

## 2022-06-02 NOTE — Progress Notes (Signed)
12 weeks 0 days post depo. Voices no concerns. Depo given today per order by Ola Spurr, CNM dated 07/01/2021. Tolerated well R delt. Annual PE due 07/02/2022 and pt plans to have PE when next depo is due, approx 08/18/2022. Has reminder. Josie Saunders, RN

## 2022-08-27 ENCOUNTER — Encounter: Payer: Self-pay | Admitting: Nurse Practitioner

## 2022-08-27 ENCOUNTER — Ambulatory Visit (LOCAL_COMMUNITY_HEALTH_CENTER): Payer: Self-pay | Admitting: Nurse Practitioner

## 2022-08-27 VITALS — BP 141/94 | Ht 62.0 in | Wt 198.4 lb

## 2022-08-27 DIAGNOSIS — Z0000 Encounter for general adult medical examination without abnormal findings: Secondary | ICD-10-CM

## 2022-08-27 DIAGNOSIS — Z3042 Encounter for surveillance of injectable contraceptive: Secondary | ICD-10-CM

## 2022-08-27 DIAGNOSIS — Z3009 Encounter for other general counseling and advice on contraception: Secondary | ICD-10-CM

## 2022-08-27 MED ORDER — MEDROXYPROGESTERONE ACETATE 150 MG/ML IM SUSP
150.0000 mg | INTRAMUSCULAR | Status: AC
  Administered 2022-08-27 – 2023-04-22 (×4): 150 mg via INTRAMUSCULAR

## 2022-08-27 NOTE — Progress Notes (Unsigned)
[facility] [employer] Number: [phone]    Family Planning Visit- Initial Visit  Subjective:  Caili Krauser is a 21 y.o.  G3P3003   being seen today for an initial annual visit and to discuss reproductive life planning.  The patient is currently using Hormonal Injection for pregnancy prevention. Patient reports she/her/hers  does not want a pregnancy in the next year.    she/her/hers report they are looking for a method that provides High efficacy at preventing pregnancy  Patient has the following medical conditions has Obesity affecting pregnancy BMI=36.3 and Breast mass, left on their problem list.  Chief Complaint  Patient presents with   Annual Exam    Patient reports to clinic today for a physical and Depo.    Body mass index is 36.29 kg/m. - Patient is eligible for diabetes screening based on BMI> 25 and age Q000111Q?  not applicable Q000111Q ordered? not applicable  Patient reports 1  partner/s in last year. Desires STI screening?  No - refused  Has patient been screened once for HCV in the past?  Yes    Does the patient have current drug use (including MJ), have a partner with drug use, and/or has been incarcerated since last result? No  If yes-- Screen for HCV through [facility]   Does the patient meet criteria for HBV testing? No  Criteria:  -Household, sexual or needle sharing contact with HBV -HIV positive -Those with known Hep C   Health Maintenance Due  Topic Date Due   COVID-19 Vaccine (1) Never done   HPV VACCINES (1 - 2-dose series) Never done   INFLUENZA VACCINE  Never done    Review of Systems  Constitutional:  Negative for chills, fever, malaise/fatigue and weight loss.       Breast pain and lump  HENT:  Negative for congestion, hearing loss and sore throat.   Eyes:  Negative for blurred vision, double vision and photophobia.  Respiratory:  Negative for shortness of  breath.   Cardiovascular:  Negative for chest pain.  Gastrointestinal:  Negative for abdominal pain, blood in stool, constipation, diarrhea, heartburn, nausea and vomiting.  Genitourinary:  Negative for dysuria and frequency.  Musculoskeletal:  Negative for back pain, joint pain and neck pain.  Skin:  Negative for itching and rash.  Neurological:  Negative for dizziness, weakness and headaches.  Endo/Heme/Allergies:  Does not bruise/bleed easily.  Psychiatric/Behavioral:  Negative for depression, substance abuse and suicidal ideas.     The following portions of the patient's history were reviewed and updated as appropriate: allergies, current medications, past family history, past medical history, past social history, past surgical history and problem list. Problem list updated.   See flowsheet for other program required questions.  Objective:   Vitals:   08/27/22 1515  BP: (!) 141/94  Weight: 198 lb 6.4 oz (90 kg)  Height: 5\' 2"  (1.575 m)    Physical Exam Constitutional:      Appearance: Normal appearance.  HENT:     Head: Normocephalic. No abrasion, masses or laceration. Hair is normal.     Jaw: No tenderness or swelling.     Right Ear: External ear normal.     Left Ear: External ear normal.     Nose: Nose normal.     Mouth/Throat:     Lips: Pink. No lesions.     Mouth: Mucous membranes are moist. No lacerations or oral lesions.  Dentition: No dental caries.     Tongue: No lesions.     Palate: No mass and lesions.     Pharynx: No pharyngeal swelling, oropharyngeal exudate, posterior oropharyngeal erythema or uvula swelling.     Tonsils: No tonsillar exudate or tonsillar abscesses.  Eyes:     Pupils: Pupils are equal, round, and reactive to light.  Neck:     Thyroid: No thyroid mass, thyromegaly or thyroid tenderness.  Cardiovascular:     Rate and Rhythm: Normal rate and regular rhythm.  Pulmonary:     Effort: Pulmonary effort is normal.     Breath sounds: Normal  breath sounds.  Chest:  Breasts:    Right: Normal. No swelling, mass, nipple discharge, skin change or tenderness.     Left: Normal. No swelling, mass, nipple discharge, skin change or tenderness.     Comments: Fibrocystic breast noted to left and right breast. Left 1 o'clock 5 cm Right 12 o'clock 2-3 cm   Abdominal:     General: Abdomen is flat. Bowel sounds are normal.     Palpations: Abdomen is soft.     Tenderness: There is no abdominal tenderness. There is no rebound.  Genitourinary:    Comments: Deferred, declined genital exam Musculoskeletal:     Cervical back: Full passive range of motion without pain and normal range of motion.  Lymphadenopathy:     Cervical: No cervical adenopathy.     Right cervical: No superficial, deep or posterior cervical adenopathy.    Left cervical: No superficial, deep or posterior cervical adenopathy.     Upper Body:     Right upper body: No supraclavicular, axillary or epitrochlear adenopathy.     Left upper body: No supraclavicular, axillary or epitrochlear adenopathy.  Skin:    General: Skin is warm and dry.     Findings: No erythema, laceration, lesion or rash.  Neurological:     Mental Status: She is alert and oriented to person, place, and time.  Psychiatric:        Attention and Perception: Attention normal.        Mood and Affect: Mood normal.        Speech: Speech normal.        Behavior: Behavior normal. Behavior is cooperative.       Assessment and Plan:  Kiauna Tulp is a 21 y.o. female presenting to the [facility] Department for an initial annual wellness/contraceptive visit  Contraception counseling: Reviewed options based on patient desire and reproductive life plan. Patient is interested in Hormonal Injection. This was provided to the patient today.   Risks, benefits, and typical effectiveness rates were reviewed.  Questions were answered.  Written information was also given to the patient to review.     The patient will follow up in  11 weeks for surveillance and Depo.  The patient was told to call with any further questions, or with any concerns about this method of contraception.  Emphasized use of condoms 100% of the time for STI prevention.  Need for ECP was assessed.  ECP not offered due to continuous use of birth control.  1. Family planning counseling -21 year old female in clinic today for a physical and Depo.  -ROS reviewed patient with complaints of breast tenderness and lumps.  Fibrocystic breast noted to left 1 o'clock 5 cm and right 12 o'clock 2-3 cm.  Patient has had some follow up  on 11/30/20 for [facility].  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  -Patient desires to continue with Depo as a birth control method. -Declines STD screening today.  -PHQ-9= 9, patient states that she is currently still nursing and states that child get up several times throughout the night and during the day she is extremely tired.  Advised patient to try to get on a routine schedule for feedings and sleep time, take a MVI, good frequent small nutritional meals, and incorporate some form of physical activities.  Patient denies thoughts of self harm.  Patient given Kathreen Cosier, LCSW card.    2. Well woman exam (no gynecological exam) -Normal well woman exam. -CBE today, next due 08/2025 -PAP due 06/2023  3. Surveillance for Depo-Provera contraception -May have Depo 150 MG IM q 11-13 weeks.  - medroxyPROGESTERone (DEPO-PROVERA) injection 150 mg    Total time spent: 30 minutes   Return in about 11 weeks (around 11/12/2022) for Routine DMPA injection.   Glenna Fellows, FNP

## 2022-08-27 NOTE — Progress Notes (Signed)
Patient here for yearly exam and Depo. Last pap was 06/21/2020, NILM. Last Depo was 06/02/2022, 12 2/7 since last Depo. Last PE was 07/01/2021, PP visit.Burt Knack, RN

## 2022-08-27 NOTE — Progress Notes (Signed)
Depo given, tolerated well, next Depo card given. Burt Knack, RN

## 2022-11-12 IMAGING — US US OB FOLLOW-UP
1 series · 13 of 28 positions shown · non-contrast
Comparison: none

CLINICAL DATA: Followup for anatomy. Patient is 24 weeks 2 days by
assigned EDC of 05/08/2021.

EXAM:
OBSTETRIC 14+ WK ULTRASOUND FOLLOW-UP

[Series 1: us ob follow up · 86 acquisitions, 13 frames shown]
[im 4/86]
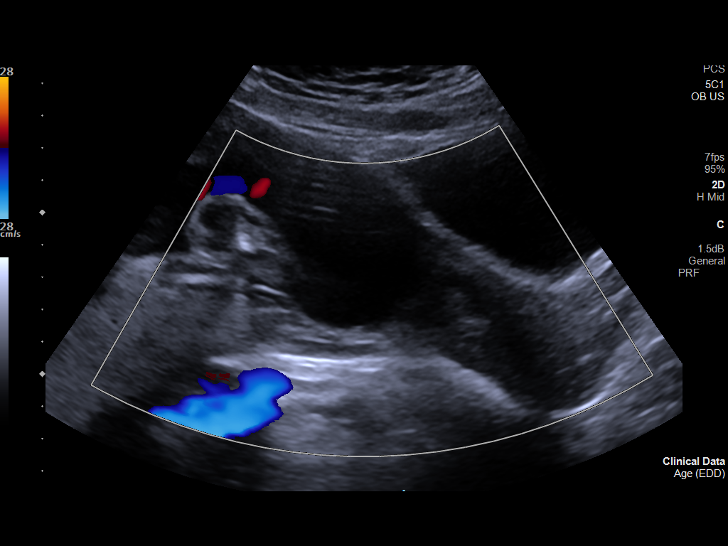
[im 10/86]
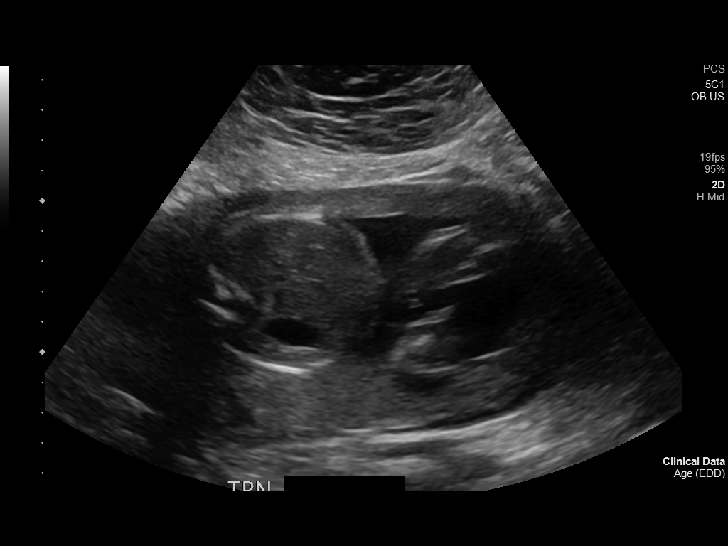
[im 16/86]
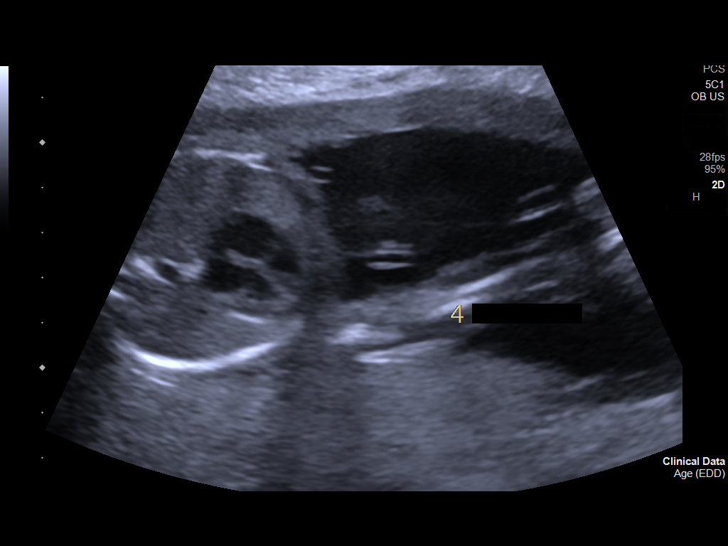
[im 23/86]
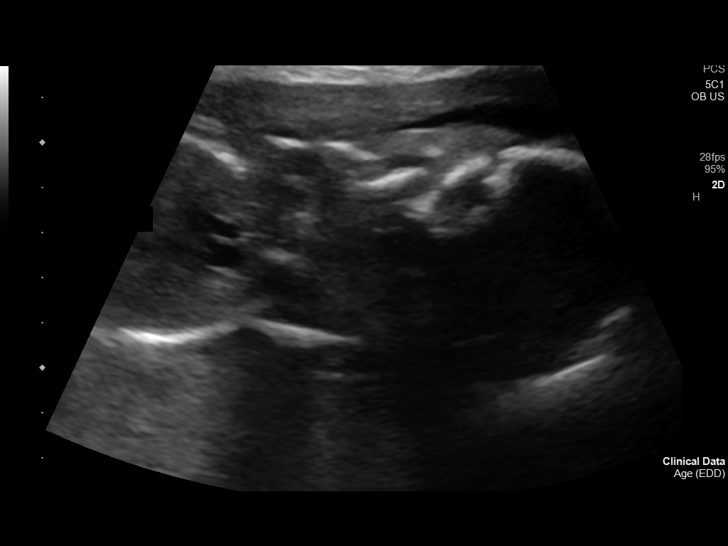
[im 29/86]
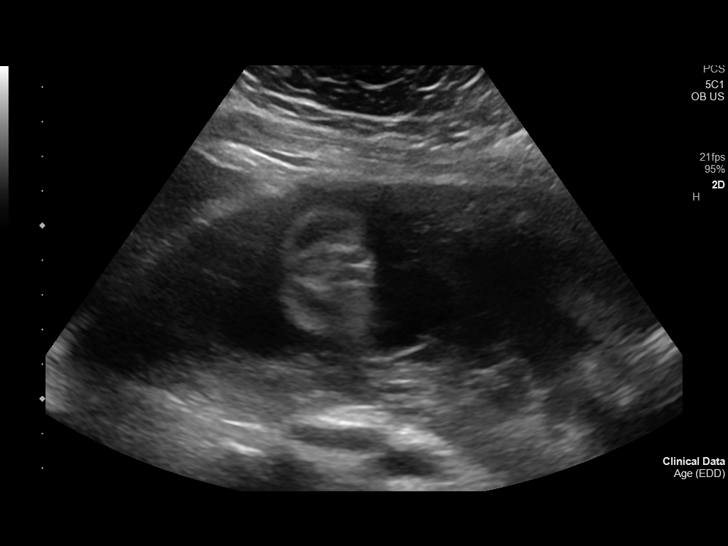
[im 35/86]
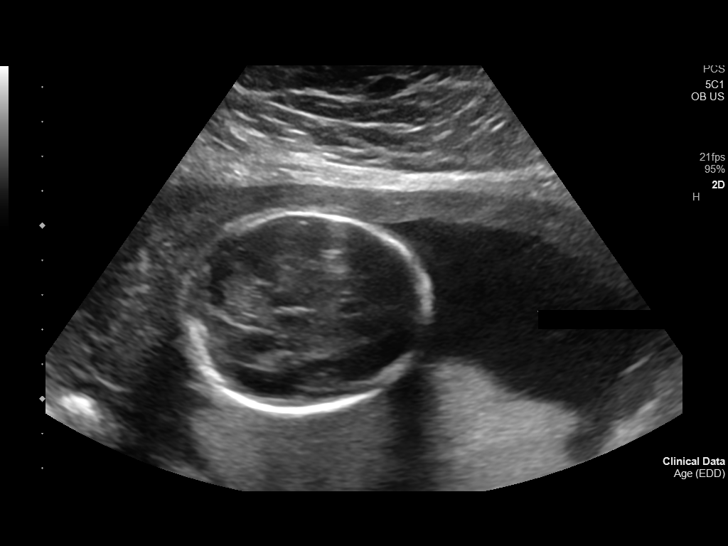
[im 45/86]
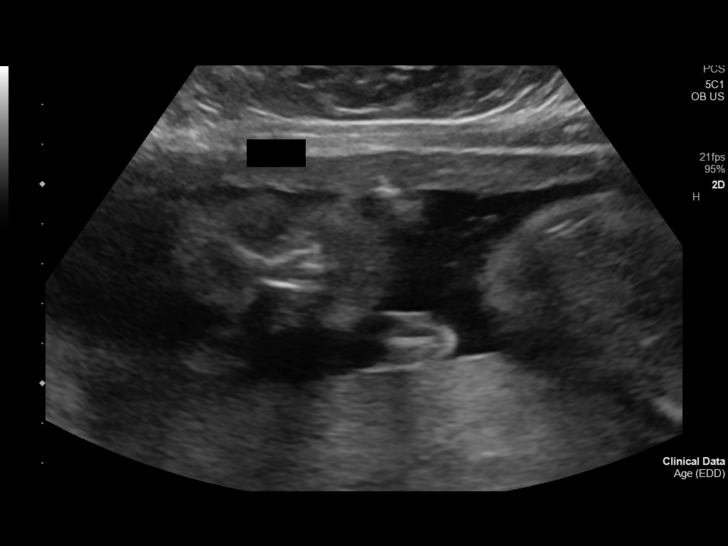
[im 51/86]
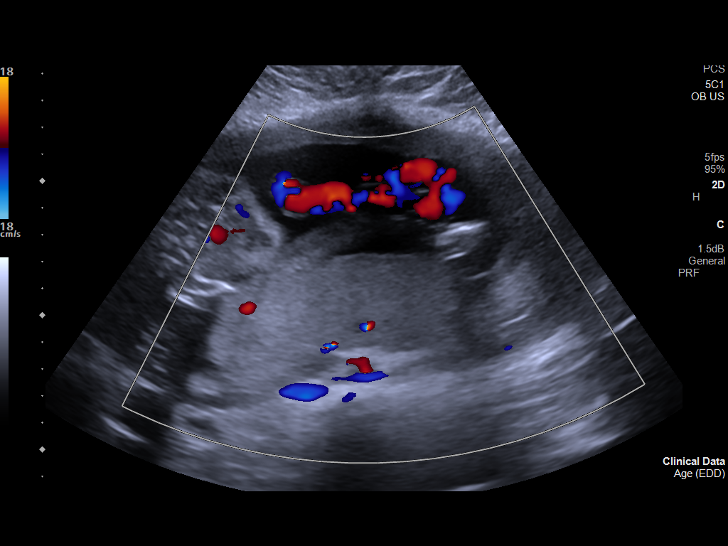
[im 57/86]
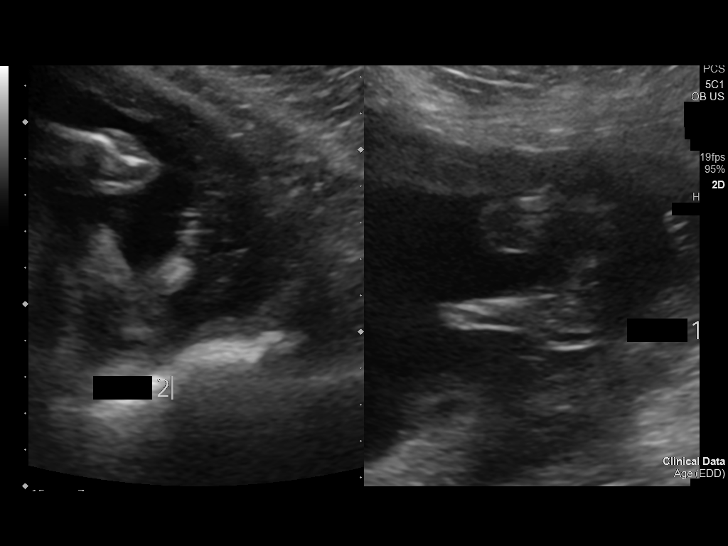
[im 63/86]
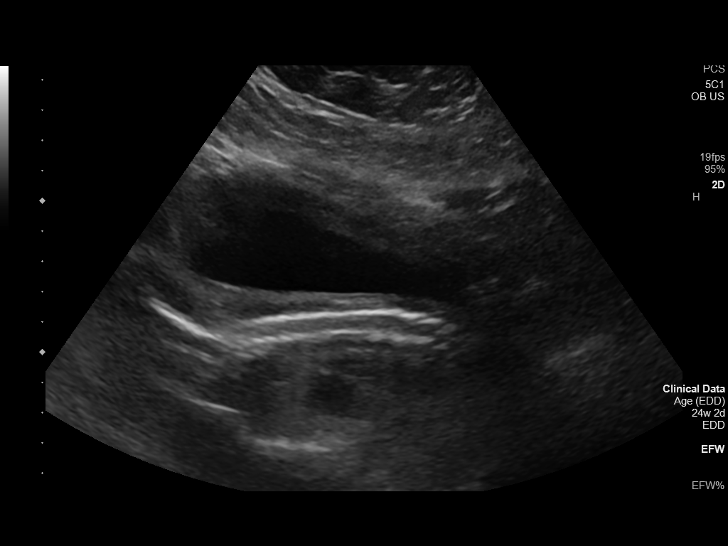
[im 70/86]
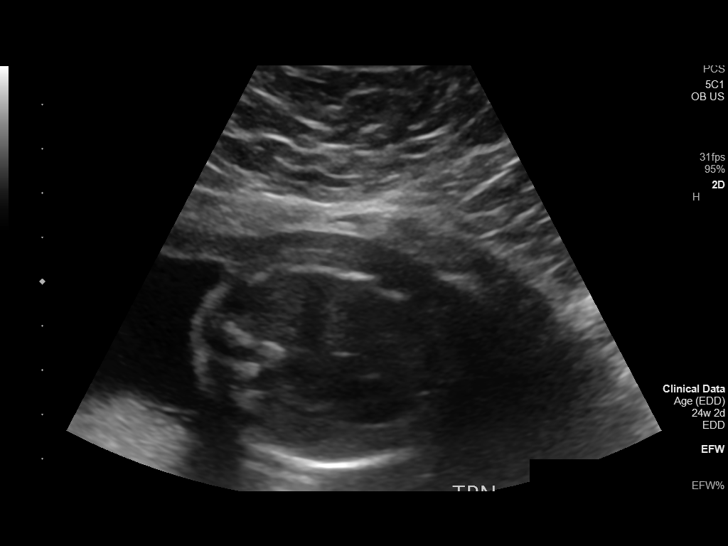
[im 76/86]
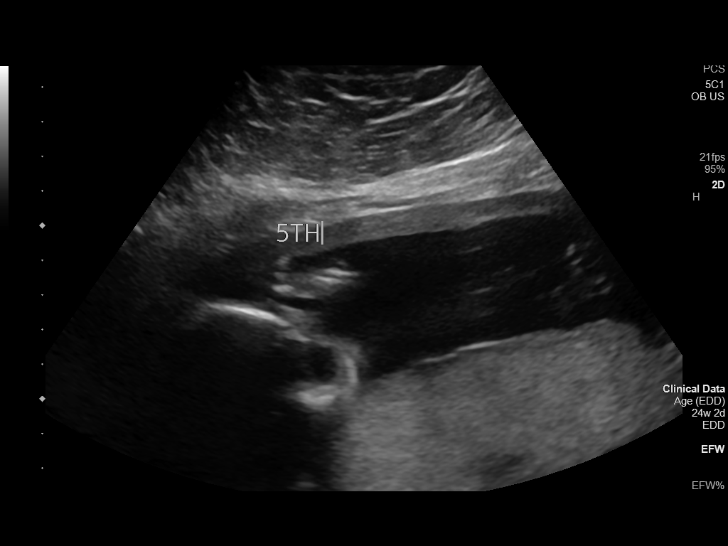
[im 82/86]
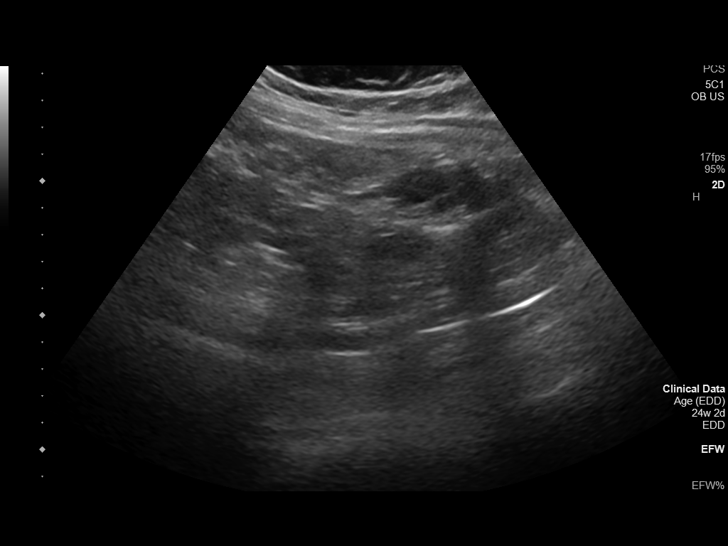

[13 of 28 positions shown; findings below may reference images not displayed]

FINDINGS: Number of Fetuses: 1

Heart Rate:  150 bpm

Movement: Present

Presentation: Breech

Previa: None

Placental Location: Posterior

Amniotic Fluid (Subjective): Normal

Amniotic Fluid (Objective):

Vertical pocket 4.3cm

FETAL BIOMETRY

BPD:  5.7cm 23w 3d

HC:    21.0cm 23w 0d

AC:    19.2cm 23w 6d

FL:    4.1cm 23w 2d

Current Mean GA: 23w 3d US EDC: 05/14/2021

Assigned GA: 24w 2d Assigned EDC: 05/08/2021

FETAL ANATOMY

Lateral Ventricles: Appears normal

Thalami/CSP: Appears normal

Posterior Fossa: Appears normal

Nuchal Region: Appears normal

Upper Lip: Appears normal

Spine: Appears normal

4 Chamber Heart on Left: Appears normal

LVOT: Appears normal

RVOT: Appears normal

Stomach on Left: Appears normal

3 Vessel Cord: Appears normal

Cord Insertion site: Not visualized

Kidneys: LEFT renal pelvis is 7.4 millimeters. RIGHT kidney and
renal pelvis are normal in appearance.

Bladder: Appears normal

Extremities: Appears normal

Technical Limitations: Maternal body habitus and fetal position.

Maternal Findings:

Cervix:  4.2 centimeters on transabdominal evaluation
IMPRESSION: 1. Single living intrauterine fetus in breech presentation.
2. Size and dates correlate within 6 days.
3. Equivocal LEFT pyelectasis is noted, with normal appearing
bladder and amniotic fluid volume. This is a common variant that may
require postnatal imaging if it persists. Followup is recommended
after 33 weeks.

## 2022-11-17 ENCOUNTER — Ambulatory Visit: Payer: Self-pay

## 2022-11-17 ENCOUNTER — Ambulatory Visit (LOCAL_COMMUNITY_HEALTH_CENTER): Payer: Self-pay

## 2022-11-17 VITALS — BP 120/70 | Ht 62.0 in | Wt 196.5 lb

## 2022-11-17 DIAGNOSIS — Z3042 Encounter for surveillance of injectable contraceptive: Secondary | ICD-10-CM

## 2022-11-17 DIAGNOSIS — Z3009 Encounter for other general counseling and advice on contraception: Secondary | ICD-10-CM

## 2022-11-17 NOTE — Progress Notes (Signed)
11 weeks 5 days post depo. Voices no concerns. Depo given today per order by Collene Leyden, FNP dated 08/27/2022. Tolerated well Left delt. Next depo due 02/02/2023, has reminder. Josie Saunders, RN

## 2023-01-12 IMAGING — US US OB FOLLOW-UP
1 series · 13 of 28 positions shown · non-contrast
Comparison: none

CLINICAL DATA: Follow-up left fetal pyelectasis

EXAM:
OBSTETRIC 14+ WK ULTRASOUND FOLLOW-UP

[Series 1: us ob follow up · 13 of 66 slices shown]
[im 3/66]
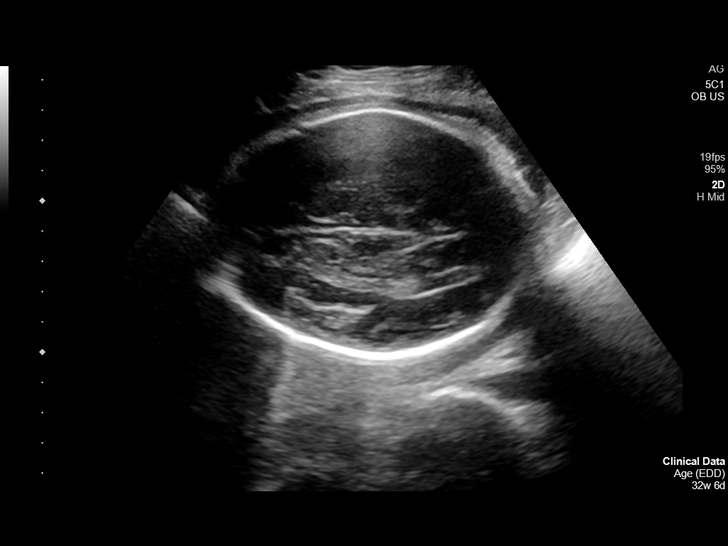
[im 8/66]
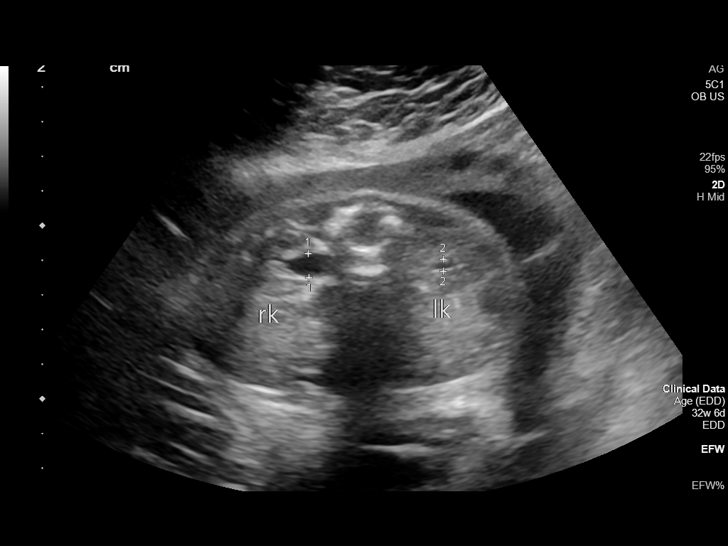
[im 13/66]
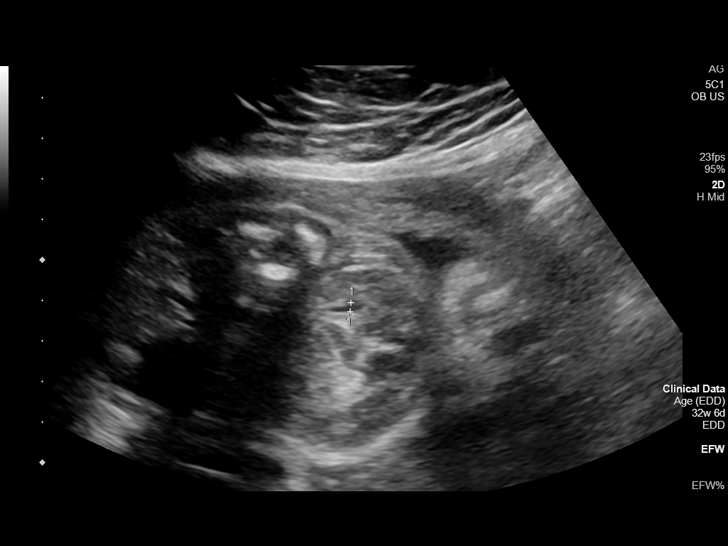
[im 17/66]
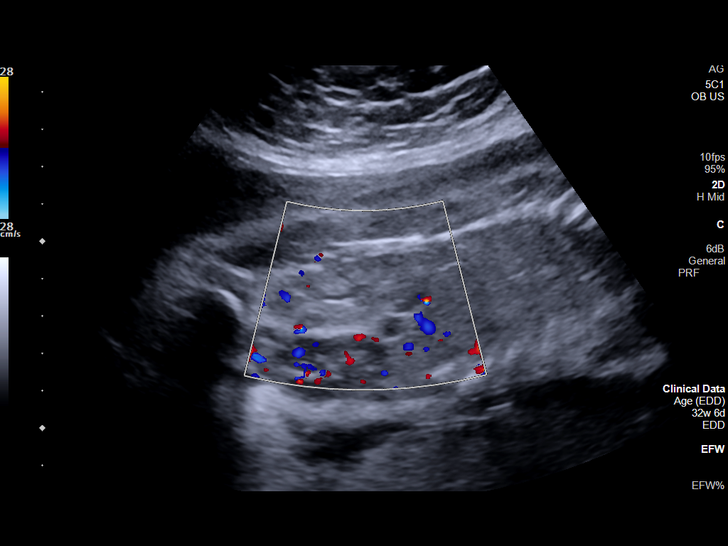
[im 22/66]
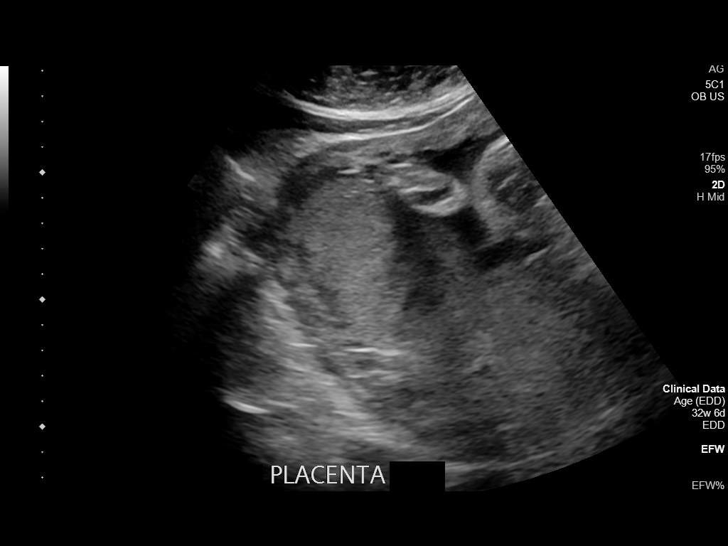
[im 27/66]
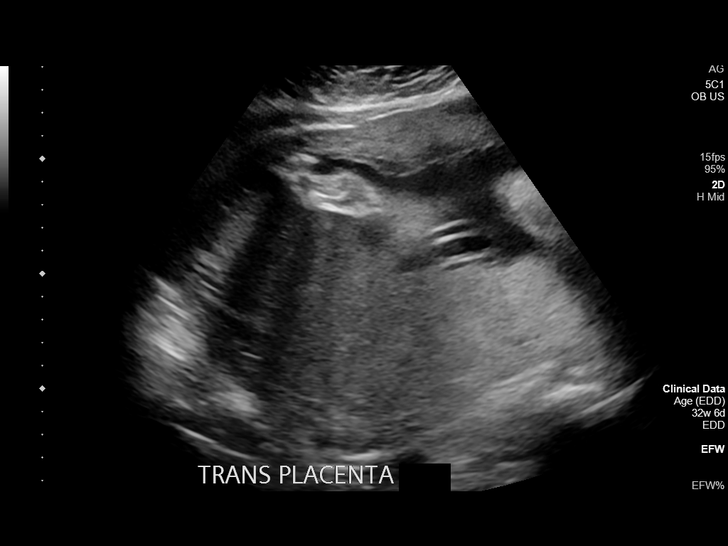
[im 34/66]
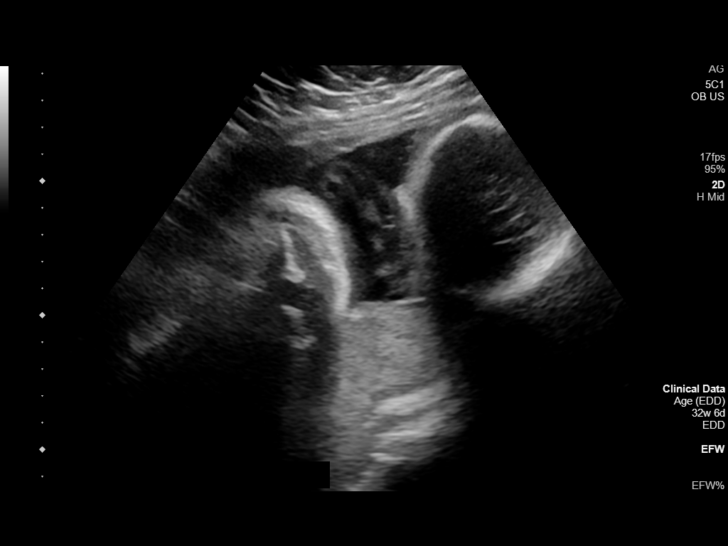
[im 39/66]
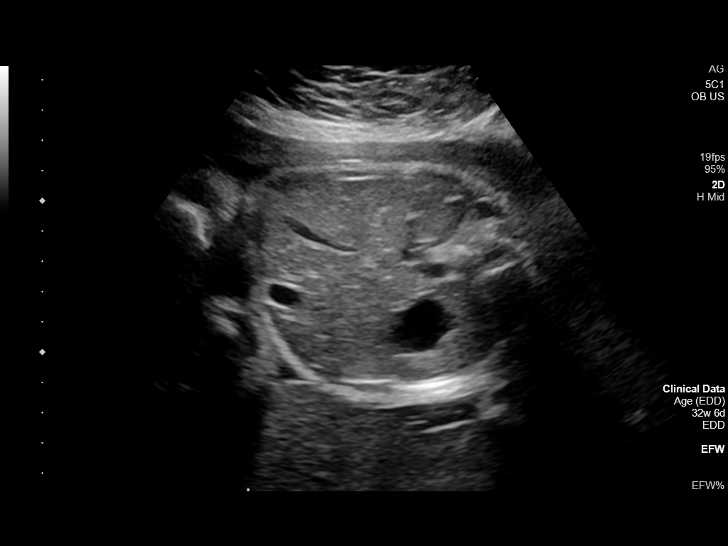
[im 44/66]
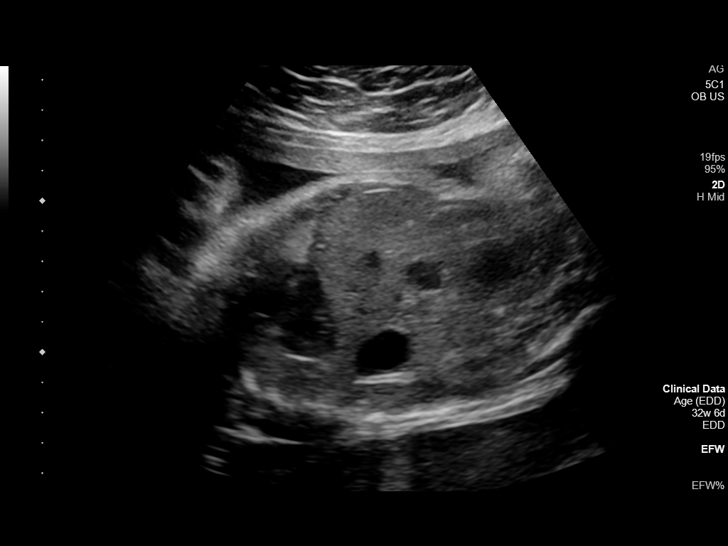
[im 49/66]
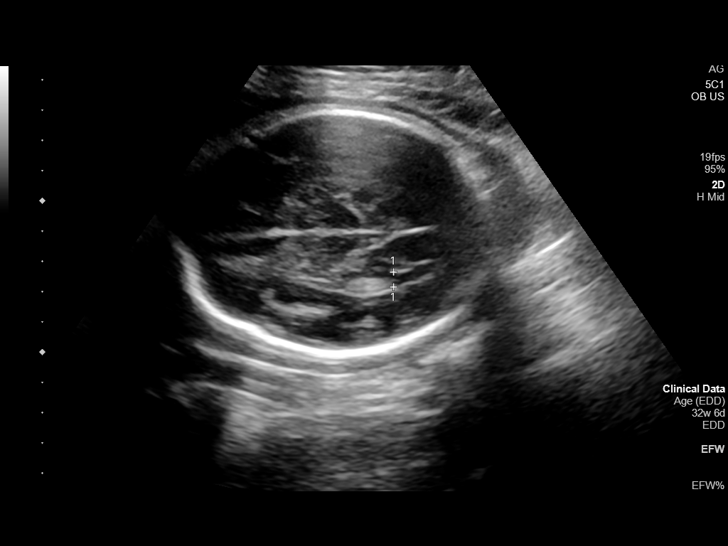
[im 53/66]
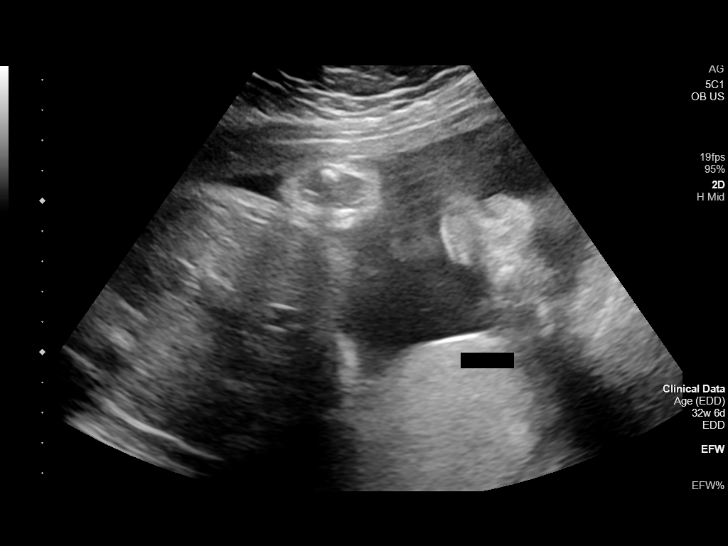
[im 58/66]
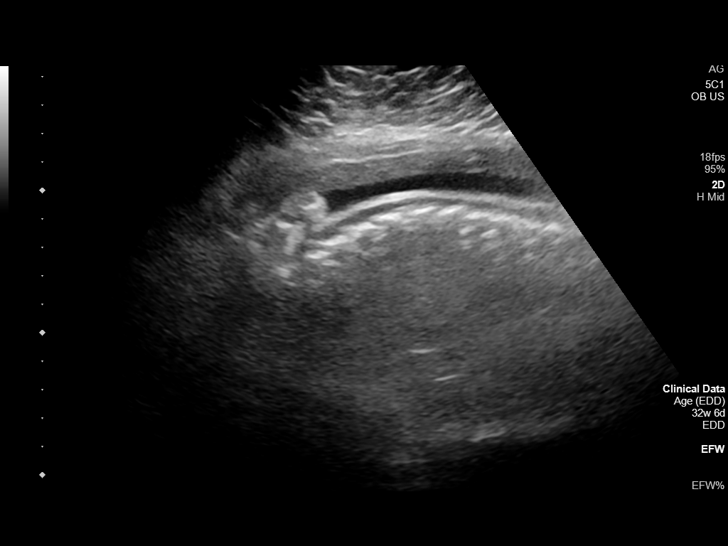
[im 63/66]
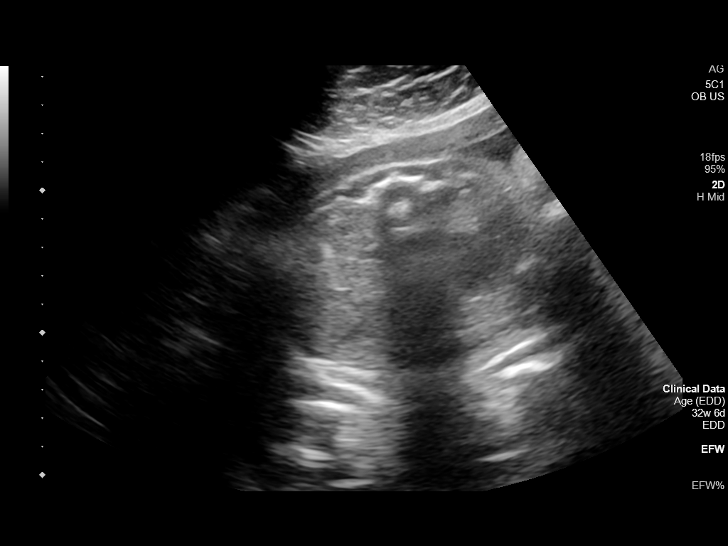

[13 of 28 positions shown; findings below may reference images not displayed]

FINDINGS: Number of Fetuses: 1

Heart Rate:  143 bpm

Movement: Yes

Presentation: Cephalic

Previa: No

Placental Location: Posterior

Amniotic Fluid (Subjective): Normal

Amniotic Fluid (Objective):

AFI 20.0 cm

FETAL BIOMETRY

BPD:  8.2cm 33w 0d

HC:    30.6cm 34w 0d

AC:    27.9cm 32w 0d

FL:    6.6cm 34w 1d

Current Mean GA: 33w 0d US EDC: 05/08/2021

Assigned GA: 32w 6d Assigned EDC: 05/09/2021

Estimated Fetal Weight:  2,076g 41%ile

FETAL ANATOMY

Previously performed

Right renal AP diameter of 7 mm (previously 2.6 mm). No caliceal
dilation.

Left renal AP diameter of 3.5 mm (previously 7.4 mm). No caliceal
dilation.

Appearance of the bladder is within normal limits.

Umbilical cord is identified adjacent to the fetal neck.

Technical Limitations: None.

Maternal Findings:

Cervix:  4.0 cm.  Appears closed.
IMPRESSION: 1. Single live intrauterine gestation in cephalic presentation.
Assigned gestational age of 32 weeks 6 days.
2. Umbilical cord is identified adjacent to the fetal neck,
concerning for nuchal cord.
3.  Resolved left renal pyelectasis.
4. Mild right renal pyelectasis is present on today's exam. Finding
is nonspecific and may be physiologic. Bladder and amniotic fluid
volume within normal limits. No caliceal dilation.

## 2023-02-03 ENCOUNTER — Ambulatory Visit (LOCAL_COMMUNITY_HEALTH_CENTER): Payer: Self-pay

## 2023-02-03 VITALS — BP 121/82 | Ht 62.0 in | Wt 202.5 lb

## 2023-02-03 DIAGNOSIS — Z3042 Encounter for surveillance of injectable contraceptive: Secondary | ICD-10-CM

## 2023-02-03 DIAGNOSIS — Z3009 Encounter for other general counseling and advice on contraception: Secondary | ICD-10-CM

## 2023-02-03 NOTE — Progress Notes (Signed)
11 weeks 1 day post depo. Wants to continue depo. States she has noticed decreased libido for past "few" months and unsure why.   Consult E Sciora, CNM who suggests that this may be related to stress and/or depression. Also decreased sex drive could be r/t depo.  Provider recommends RN to offer patient contact info for [employer], LCSW.   RN carried out provider orders. Patient accepts contact card for A Marvin, LCSW and pt states plans to contact her. Questions answered and reports understanding.  Depo given per order by Onalee Hua, FNP dated 08/27/2022. Tolerated well R delt. Next depo due 04/21/2023 and reminder given. Jerel Shepherd, RN

## 2023-02-05 NOTE — Progress Notes (Signed)
11 weeks 1 day post depo. Wants to continue depo. States she has noticed decreased libido for past "few" months and unsure why.   Consult E Tylasia Fletchall, CNM who suggests that this may be related to stress and/or depression. Also decreased sex drive could be r/t depo.  Provider recommends RN to offer patient contact info for [employer], LCSW.   RN carried out provider orders. Patient accepts contact card for A Marvin, LCSW and pt states plans to contact her. Questions answered and reports understanding.  Depo given per order by Onalee Hua, FNP dated 08/27/2022. Tolerated well R delt. Next depo due 04/21/2023 and reminder given. Paralee Cancel, RN Consulted on the plan of care for this client.  I agree with the documented note and actions taken to provide care for this client.  Hazle Coca, CNM

## 2023-04-22 ENCOUNTER — Ambulatory Visit: Payer: Self-pay

## 2023-04-22 ENCOUNTER — Ambulatory Visit (LOCAL_COMMUNITY_HEALTH_CENTER): Payer: Self-pay

## 2023-04-22 DIAGNOSIS — Z3009 Encounter for other general counseling and advice on contraception: Secondary | ICD-10-CM

## 2023-04-22 DIAGNOSIS — Z3042 Encounter for surveillance of injectable contraceptive: Secondary | ICD-10-CM

## 2023-04-22 NOTE — Progress Notes (Signed)
11w 1d post depo. Voices no concerns. Depo given today per order by A. White, FNP dated 08/27/2022. Tolerated well in L deltoid. Next depo due 07/08/2023; patient aware.   Abagail Kitchens, RN

## 2023-06-30 ENCOUNTER — Ambulatory Visit (LOCAL_COMMUNITY_HEALTH_CENTER): Payer: Self-pay | Admitting: Advanced Practice Midwife

## 2023-06-30 VITALS — BP 125/93 | Ht 62.0 in | Wt 216.0 lb

## 2023-06-30 DIAGNOSIS — Z30430 Encounter for insertion of intrauterine contraceptive device: Secondary | ICD-10-CM

## 2023-06-30 DIAGNOSIS — Z3009 Encounter for other general counseling and advice on contraception: Secondary | ICD-10-CM

## 2023-06-30 DIAGNOSIS — R030 Elevated blood-pressure reading, without diagnosis of hypertension: Secondary | ICD-10-CM | POA: Insufficient documentation

## 2023-06-30 DIAGNOSIS — Z30018 Encounter for initial prescription of other contraceptives: Secondary | ICD-10-CM

## 2023-06-30 LAB — WET PREP FOR TRICH, YEAST, CLUE: Trichomonas Exam: NEGATIVE

## 2023-06-30 LAB — HEMOGLOBIN, FINGERSTICK: Hemoglobin: 14.8 g/dL (ref 11.1–15.9)

## 2023-06-30 MED ORDER — PARAGARD INTRAUTERINE COPPER IU IUD
1.0000 | INTRAUTERINE_SYSTEM | Freq: Once | INTRAUTERINE | Status: AC
Start: 2023-06-30 — End: 2023-06-30
  Administered 2023-06-30: 1 via INTRAUTERINE

## 2023-06-30 NOTE — Progress Notes (Signed)
Hgb and wet prep reviewed, no treatment indicated. Patient aware to schedule yearly PE and Pap in December 2024.Marland KitchenMarland KitchenBurt Knack, RN

## 2023-06-30 NOTE — Progress Notes (Signed)
[facility] [address] Main Number: [phone]  Contraception/Family Planning VISIT ENCOUNTER NOTE  Subjective:   Savannah James is a 22 y.o. [employer] [id number](8,7,5) female here for reproductive life counseling. The patient is currently using Hormonal Injection to prevent pregnancy.   LMP not while on DMPA. Last DMPA given 04/22/23. Last sex 06/28/23 without condom; with current partner x 1 year. Last PE 08/27/22. Last pap 06/21/20 neg. Wants Paraguard. Pt c/o fatigue and weight gain. 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 The patient does not want a pregnancy in the next year.    Client states they are looking for the following:  High efficacy at preventing pregnancy  Denies abnormal vaginal bleeding, discharge, pelvic pain, problems with intercourse or other gynecologic concerns.    Gynecologic History No LMP recorded (lmp unknown). Patient has had an injection.  Health Maintenance Due  Topic Date Due   HPV VACCINES (1 - 3-dose series) Never done   INFLUENZA VACCINE  Never done   COVID-19 Vaccine (1 - 2023-24 season) Never done   Cervical Cancer Screening (Pap smear)  06/22/2023     The following portions of the patient's history were reviewed and updated as appropriate: allergies, current medications, past family history, past medical history, past social history, past surgical history and problem list.  Review of Systems Pertinent items are noted in HPI.   Objective:  BP (!) 125/93   Ht 5\' 2"  (1.575 m)   Wt 216 lb (98 kg)   LMP  (LMP Unknown) Comment: no periods with depo  BMI 39.51 kg/m  Gen: well appearing, NAD HEENT: no scleral icterus CV: RR Lung: Normal WOB Ext: warm well perfused  PELVIC: Normal appearing external genitalia; normal appearing vaginal mucosa and cervix.  No abnormal discharge noted.  Pap smear not obtained.  Normal uterine size, no other palpable masses, no uterine or adnexal  tenderness.   Assessment and Plan:   Need for emergency contraceptive care was assessed today.  Last unprotected sex was:  06/28/23 with DMPA   Patient reported not meeting criteria.  Reviewed options and patient desired Cu-IUD   Contraception counseling: Reviewed methods in a patient centered fashion and used shared decision making with the patient. Utilized [employer] patient education tools as appropriate. The patient stated there goals and desires from a method are: High efficacy at preventing pregnancy  We reviewed the following methods in detail based on patient preferences available included: IUD or IUS  Patient expressed they would like IUD or IUS  This was provided to the patient today.  if not why not clearly documented  Risks, benefits, and typical effectiveness rates were reviewed.  Questions were answered.  Written information was also given to the patient to review.     she/her/hers  will follow up in  2 months for surveillance.  she/her/hers was told to call with any further questions, or with any concerns about this method of contraception or cycle control.  Emphasized use of condoms 100% of the time for STI prevention.   1. Family planning GC/Chlamydia/Hgb/wet mount done Needs to return 08/2023 for pap and physcia  - Chlamydia/Gonorrhea [facility] - WET PREP FOR TRICH, YEAST, CLUE - Hemoglobin, venipuncture  2. Encounter for initial prescription of other contraceptives    Patient presented to [facility] for IUD insertion. Her GC/CT screening was found to be up to date and using WHO criteria we can be reasonably certain she is not pregnant or  a pregnancy test was obtained which was Urine pregnancy test  today was N/A.  See Flowsheet for IUD check list  IUD Insertion Procedure Note--Paraguard Patient identified, informed consent performed, consent signed.   Discussed risks of irregular bleeding, cramping, infection, malpositioning or misplacement of the IUD outside  the uterus which may require further procedure such as laparoscopy. Time out was performed.    Speculum placed in the vagina.  Cervix visualized.  Cleaned with Betadine x 2.  Cervix Grasped anteriorly with a single tooth tenaculum.  Uterus sounded to 9 cm.  IUD placed per manufacturer's recommendations.  Strings trimmed to 3 cm. Tenaculum was removed, good hemostasis noted.  Patient tolerated procedure well.   Patient was given post-procedure instructions- both agency handout and verbally by provider.  She was advised to have backup contraception for one week.  Patient was also asked to check IUD strings periodically or follow up in 4 weeks for IUD check.   3. Elevated blood pressure reading 06/30/23 125/93 Referred to primary care MD for f/u    Please refer to After Visit Summary for other counseling recommendations.   Return in about 2 months (around 08/31/2023) for yearly physical exam, IUD insertion.  Alberteen Spindle, CNM [facility] DEPARTMENT

## 2023-06-30 NOTE — Progress Notes (Addendum)
Client here today to switch from Depo to Paraguard IUD. Last PE 08/27/2022. Last Pap was 06/21/2020 and was NILM. Last Depo was 04/22/23, 9 6/7 since last Depo.Marland KitchenMarland KitchenMarland KitchenBurt Knack, RN

## 2024-05-23 ENCOUNTER — Ambulatory Visit: Payer: Self-pay

## 2024-05-23 VITALS — BP 123/87 | HR 86 | Ht 62.0 in | Wt 212.0 lb

## 2024-05-23 DIAGNOSIS — Z30011 Encounter for initial prescription of contraceptive pills: Secondary | ICD-10-CM

## 2024-05-23 DIAGNOSIS — Z124 Encounter for screening for malignant neoplasm of cervix: Secondary | ICD-10-CM

## 2024-05-23 DIAGNOSIS — D249 Benign neoplasm of unspecified breast: Secondary | ICD-10-CM

## 2024-05-23 DIAGNOSIS — Z131 Encounter for screening for diabetes mellitus: Secondary | ICD-10-CM

## 2024-05-23 DIAGNOSIS — Z3009 Encounter for other general counseling and advice on contraception: Secondary | ICD-10-CM

## 2024-05-23 DIAGNOSIS — Z30432 Encounter for removal of intrauterine contraceptive device: Secondary | ICD-10-CM

## 2024-05-23 DIAGNOSIS — Z113 Encounter for screening for infections with a predominantly sexual mode of transmission: Secondary | ICD-10-CM

## 2024-05-23 LAB — WET PREP FOR TRICH, YEAST, CLUE
Trichomonas Exam: NEGATIVE
Yeast Exam: NEGATIVE

## 2024-05-23 MED ORDER — NORGESTIMATE-ETH ESTRADIOL 0.25-35 MG-MCG PO TABS: 1.0000 | ORAL_TABLET | Freq: Every day | ORAL | 3 refills | Status: AC

## 2024-05-23 NOTE — Progress Notes (Signed)
 Pt is here for family planning visit.  Family planning education card reviewed and given to pt.  Wet prep results reviewed, no treatment required per standing orders. The patient was dispensed #3 mili (ortho cyclen)  today. I provided counseling today regarding the medication. We discussed the medication, the side effects and when to call clinic. Patient given the opportunity to ask questions. Questions answered.  Larraine JONELLE Northern, RN

## 2024-05-23 NOTE — Progress Notes (Signed)
 [employer] [facility]PART[facility] [address] [place] [address] Main phone: [phone]  Family Planning Visit - Repeat Yearly Visit  Subjective:  Savannah James is a 23 y.o. [phone]  being seen today for an annual wellness visit and to discuss contraception options. The patient is currently using IUD (or IUS) for pregnancy prevention. Patient does not want a pregnancy in the next year.   Patient reports they are looking for a method with the following characteristics:  Method they can control starting and stopping  Patient has the following medical problems:  Patient Active Problem List   Diagnosis Date Noted   Elevated blood pressure reading 06/30/23 125/93 06/30/2023   Obesity affecting pregnancy BMI=39.5 10/31/2020   Breast mass, left 10/31/2020   Chief Complaint  Patient presents with   Annual Exam   HPI Patient reports desire for IUD removal. Having discharge that is dark brown, bad odor for 2 months. Odor worse before/after her period. Having brown discharge even between periods. LMP 8/16, has a period every month. Periods are heavy, having to wear diapers when she has her period. Her periods have been a lot heavier since she had copper  IUD placed. Pap due today. Was NILM 07/01/20.  Desire gonorrhea/chlamydia testing today but no HIV/RPR. Also endorses breast lumps. 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 Endorses blurry vision especially at night. Never had eye exam. Advised eye exam. Gaining weight without change in diet and exercise. Same diet, trying to lose weight but gaining instead.  Desires OCPs. No hx of migraines, blood clots/DVT/PE. High blood pressure at times but only during delivery of her children. Has some breast lumps that have been there, have bothered her/having been painful. Had a biopsy in 2022 - fibroadenoma.  Review of Systems  Eyes:  Positive for blurred  vision.  All other systems reviewed and are negative.  See flowsheet for further details and programmatic requirements Hyperlink available at the top of the signed note in blue.  Flow sheet content below:  Pregnancy Intention Screening Does the patient want to become pregnant in the next year?: No Does the patient's partner want to become pregnant in the next year?: No Would the patient like to discuss contraceptive options today?: Yes Other:  Is it okay to contact you by mail?: Yes Difficulty accessing hygiene products (feminine products/soap) in the last 3 months: No Contraception History Past methods of contraception used by patient:: Hormonal Injection, Intrauterine device or system (IDU,IUS), Hormonal Implant Adverse effects associated with Hormonal Injection: none Adverse effects associated with Intrauterine device or system (IUD,IUS): none Adverse effects associated with Hormonal Implant: none Sexual History What age did you start your period?: 10 How often do you have your period?: monthly Date of last sex?: 05/22/24 Has the patient had unprotected sex within the last 5 days?: Yes Do you have sex with men, women, both men and women?: Men only In the past 2 months how many partners have you had sex with?: 1 In the past 12 months, how many partners have you had sex with?: 1 Is it possible that any of your sex partners in the past 12 months had sex with someone else whild they were still in a sexual relationship with you?: No What ways do you have sex?: Vaginal Do you or your partner use condoms and/or dental dams every time you have vaginal, oral or anal sex?: No Do you douche?: No Date of last HIV  test?: 07/01/21 Have you ever had an STD?: No Have any of your partners had an STD?: No Have you or your partner ever shot up drugs?: No Have any of your partners used drugs in the past?: No Have you or your partners exchanged money or drugs for sex?: No Counseling All Patients:  LARCS discussed, Use specific methods of contraceoptive and identify adverse effects (R), Methods of contraception including abstinence reviewed by tiered approach Education: Make informed decision about family planning, Provided preconception counseling, Reduce risk of transmission and protection from STD's and HIV, Understand BMI >25 or >18.5 is a health risk (weight management educational materials to be provided to client requests), Promoted daily consumption of MVI with folic acid if capable of conceiving., Review immunization history, inform client of recommended vaccines per CDC's ACIP Guidelines and refer to Immunization clinic, Results of physical assessment and labs (if performed), How to discontinue the method selected and information on back up method used, How to use the method selected and information on back up method used, How to use the method consistently and correctly, Teach back method completed, Warning signs for rare but serious adverse events and what to do if they experience a warning sign (including emergency 24 hour number, where to seek emergency service outside of hours of operation), When to return for follow up (planned return schedule), Is patient pregnant?, PCP list given to patient Contraception Wrap Up Current Method: IUD or IUS End Method: Oral Contraceptive Contraception Counseling Provided: Yes How was the end contraceptive method provided?: Provided on site  Diabetes screening This patient is 23 y.o. with a BMI of Body mass index is 38.78 kg/m.SABRA  Is patient eligible for diabetes screening (age >35 and BMI >25)?  yes  Was Hgb A1c ordered? yes  STI screening Patient reports 1 of partners in last year.  Does this patient desire STI screening?  Yes Declines all blood tests.  Cervical Cancer Screening  Result Date Procedure Results Follow-ups  06/21/2020 Pap IG (Image Guided) DIAGNOSIS:: Comment Specimen adequacy:: Comment Clinician Provided ICD10:  Comment Performed by:: Comment PAP Smear Comment: . Note:: Comment Test Methodology: Comment   02/27/2020 Pap IG (Image Guided) DIAGNOSIS:: Comment Specimen adequacy:: Comment Clinician Provided ICD10: Comment Performed by:: Comment QC reviewed by:: Comment PAP Smear Comment: . Note:: Comment Test Methodology: CANCELED     Health Maintenance Due  Topic Date Due   HPV VACCINES (1 - 3-dose series) Never done   Hepatitis B Vaccines 19-59 Average Risk (1 of 3 - 19+ 3-dose series) Never done   Cervical Cancer Screening (Pap smear)  06/22/2023   Influenza Vaccine  Never done   COVID-19 Vaccine (1 - 2024-25 season) Never done   The following portions of the patient's history were reviewed and updated as appropriate: allergies, current medications, past family history, past medical history, past social history, past surgical history and problem list. Problem list updated.  Objective:   Vitals:   05/23/24 1322  BP: 123/87  Pulse: 86  Weight: 212 lb (96.2 kg)  Height: 5' 2 (1.575 m)   Physical Exam Vitals and nursing note reviewed. Exam conducted with a chaperone present Calleen Northern).  Constitutional:      Appearance: Normal appearance.  HENT:     Head: Normocephalic and atraumatic.     Mouth/Throat:     Mouth: Mucous membranes are moist.     Pharynx: Oropharynx is clear. No oropharyngeal exudate or posterior oropharyngeal erythema.  Cardiovascular:     Heart sounds: Normal  heart sounds, S1 normal and S2 normal.  Pulmonary:     Effort: Pulmonary effort is normal.     Breath sounds: Normal breath sounds.  Chest:  Breasts:    Right: Mass present. No swelling, bleeding, inverted nipple, nipple discharge, skin change or tenderness.     Left: Normal. No swelling, bleeding, inverted nipple, mass, nipple discharge, skin change or tenderness.       Comments: Multiple firm, round masses of right breast 1 o clock, 2 o clock, 3 o clock Abdominal:     General: Abdomen is flat.      Palpations: Abdomen is soft. There is no mass.     Tenderness: There is no abdominal tenderness. There is no rebound.  Genitourinary:    General: Normal vulva.     Exam position: Lithotomy position.     Pubic Area: No rash or pubic lice.      Labia:        Right: No rash or lesion.        Left: No rash or lesion.      Vagina: Normal. No vaginal discharge, erythema, bleeding or lesions.     Cervix: No cervical motion tenderness, discharge, friability, lesion or erythema.     Comments: pH = <4.5 IUD visualized and removed without difficulty Lymphadenopathy:     Head:     Right side of head: No preauricular or posterior auricular adenopathy.     Left side of head: No preauricular or posterior auricular adenopathy.     Cervical: No cervical adenopathy.     Upper Body:     Right upper body: No supraclavicular, axillary or epitrochlear adenopathy.     Left upper body: No supraclavicular, axillary or epitrochlear adenopathy.     Lower Body: No right inguinal adenopathy. No left inguinal adenopathy.  Skin:    General: Skin is warm and dry.     Findings: No rash.  Neurological:     Mental Status: She is alert and oriented to person, place, and time.    Assessment and Plan:  Rhilynn Preyer is a 23 y.o. female [phone] presenting to the [facility] Department for an yearly wellness and contraception visit  1. Family planning (Primary)  Contraception counseling:  Reviewed options based on patient desire and reproductive life plan. Patient is interested in Oral Contraceptive. This was provided to the patient today.  Risks, benefits, and typical effectiveness rates were reviewed.  Questions were answered.  Written information was also given to the patient to review.    The patient will follow up in  3 months for surveillance.  The patient was told to call with any further questions, or with any concerns about this method of contraception.  Emphasized use of condoms 100% of  the time for STI prevention.  Emergency Contraception Precautions (ECP): Patient assessed for need of ECP. She is not a candidate based on LARC in place and unexpired .   2. Screening for venereal disease  - WET PREP FOR TRICH, YEAST, CLUE - Chlamydia/Gonorrhea [facility]  3. Screening for diabetes mellitus  - C/o skin changes on neck: darkened areas representing acanthosis nigricans - Endorses thirst, urinating frequently - Hgb A1c w/o eAG   4. Encounter for IUD removal  Procedure:  IUD Removal  Patient identified, informed consent performed, consent signed.  Patient was in the dorsal lithotomy position, normal external genitalia was noted.  A speculum was placed in the patient's vagina, normal discharge was noted, no lesions.  The cervix was visualized, no lesions, no abnormal discharge.  The strings of the IUD were grasped and pulled using ring forceps. The IUD was removed in its entirety. Patient tolerated the procedure well.    Patient will use pills for contraception. .  Routine preventative health maintenance measures emphasized.   5. Oral contraception initiation  - No contraindications for estrogen - BP today 123/87 - norgestimate -ethinyl estradiol  (ORTHO-CYCLEN) 0.25-35 MG-MCG tablet; Take 1 tablet by mouth daily for 28 days.; Refill: 3  - Follow up in 3 months to assess desire to continue OCPs. 3 months of pills provided today.  6. Screening for cervical cancer  - IGP, rfx Aptima HPV ASCU   7. 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 - Patient reports they are increasing in size, tender. She has difficulty lying on that side due to the discomfort. - Referral to [facility]  Return in about 3 months (around 08/22/2024).  No future appointments.  Damien FORBES Satchel, NP

## 2024-05-24 LAB — HGB A1C W/O EAG: Hgb A1c MFr Bld: 4.8 % (ref 4.8–5.6)

## 2024-05-25 ENCOUNTER — Ambulatory Visit: Payer: Self-pay

## 2024-05-25 LAB — IGP, RFX APTIMA HPV ASCU: PAP Smear Comment: 0

## 2024-05-25 NOTE — Progress Notes (Signed)
 Normal pap, negative for intraepithelial lesion or malignancy. Follow up/repeat pap in 3 years. Normal HgbA1c, no evidence of diabetes or prediabetes.  Damien Satchel [facility]

## 2024-10-17 ENCOUNTER — Ambulatory Visit: Payer: Self-pay
